# Patient Record
Sex: Female | Born: 1940
Health system: Southern US, Community
[De-identification: ages and names within clinical notes are randomized; demographics above are authoritative.]

## PROBLEM LIST (undated history)

## (undated) DIAGNOSIS — R112 Nausea with vomiting, unspecified: Secondary | ICD-10-CM

## (undated) DIAGNOSIS — C50919 Malignant neoplasm of unspecified site of unspecified female breast: Secondary | ICD-10-CM

## (undated) DIAGNOSIS — Z780 Asymptomatic menopausal state: Secondary | ICD-10-CM

## (undated) DIAGNOSIS — M8580 Other specified disorders of bone density and structure, unspecified site: Secondary | ICD-10-CM

## (undated) DIAGNOSIS — K219 Gastro-esophageal reflux disease without esophagitis: Secondary | ICD-10-CM

## (undated) DIAGNOSIS — I454 Nonspecific intraventricular block: Secondary | ICD-10-CM

## (undated) DIAGNOSIS — K649 Unspecified hemorrhoids: Secondary | ICD-10-CM

## (undated) DIAGNOSIS — Z803 Family history of malignant neoplasm of breast: Secondary | ICD-10-CM

## (undated) DIAGNOSIS — Z853 Personal history of malignant neoplasm of breast: Secondary | ICD-10-CM

## (undated) DIAGNOSIS — I8390 Asymptomatic varicose veins of unspecified lower extremity: Secondary | ICD-10-CM

## (undated) DIAGNOSIS — N3020 Other chronic cystitis without hematuria: Secondary | ICD-10-CM

## (undated) DIAGNOSIS — G8929 Other chronic pain: Secondary | ICD-10-CM

## (undated) DIAGNOSIS — I2510 Atherosclerotic heart disease of native coronary artery without angina pectoris: Secondary | ICD-10-CM

## (undated) DIAGNOSIS — B54 Unspecified malaria: Secondary | ICD-10-CM

## (undated) DIAGNOSIS — Z9889 Other specified postprocedural states: Secondary | ICD-10-CM

## (undated) DIAGNOSIS — Z8619 Personal history of other infectious and parasitic diseases: Secondary | ICD-10-CM

## (undated) DIAGNOSIS — N390 Urinary tract infection, site not specified: Secondary | ICD-10-CM

## (undated) DIAGNOSIS — B159 Hepatitis A without hepatic coma: Secondary | ICD-10-CM

## (undated) DIAGNOSIS — C801 Malignant (primary) neoplasm, unspecified: Secondary | ICD-10-CM

## (undated) DIAGNOSIS — M810 Age-related osteoporosis without current pathological fracture: Secondary | ICD-10-CM

## (undated) DIAGNOSIS — R351 Nocturia: Secondary | ICD-10-CM

## (undated) DIAGNOSIS — M1990 Unspecified osteoarthritis, unspecified site: Secondary | ICD-10-CM

## (undated) DIAGNOSIS — Z8489 Family history of other specified conditions: Secondary | ICD-10-CM

## (undated) DIAGNOSIS — M51369 Other intervertebral disc degeneration, lumbar region without mention of lumbar back pain or lower extremity pain: Secondary | ICD-10-CM

## (undated) DIAGNOSIS — M719 Bursopathy, unspecified: Secondary | ICD-10-CM

## (undated) DIAGNOSIS — M5136 Other intervertebral disc degeneration, lumbar region: Secondary | ICD-10-CM

## (undated) HISTORY — DX: Hepatitis a without hepatic coma: B15.9

## (undated) HISTORY — DX: Other specified disorders of bone density and structure, unspecified site: M85.80

## (undated) HISTORY — DX: Family history of malignant neoplasm of breast: Z80.3

## (undated) HISTORY — DX: Malignant neoplasm of unspecified site of unspecified female breast: C50.919

## (undated) HISTORY — DX: Personal history of malignant neoplasm of breast: Z85.3

## (undated) HISTORY — PX: BARIATRIC SURGERY: SHX1103

## (undated) HISTORY — DX: Unspecified malaria: B54

## (undated) HISTORY — DX: Malignant (primary) neoplasm, unspecified: C80.1

## (undated) HISTORY — DX: Nonspecific intraventricular block: I45.4

## (undated) HISTORY — PX: SKIN CANCER DESTRUCTION: SHX778

## (undated) HISTORY — DX: Unspecified osteoarthritis, unspecified site: M19.90

---

## 1974-04-08 DIAGNOSIS — B159 Hepatitis A without hepatic coma: Secondary | ICD-10-CM

## 1974-04-08 HISTORY — DX: Hepatitis a without hepatic coma: B15.9

## 2006-08-07 HISTORY — PX: VAGINAL HYSTERECTOMY: SUR661

## 2009-01-19 ENCOUNTER — Ambulatory Visit (HOSPITAL_COMMUNITY): Admission: RE | Admit: 2009-01-19 | Discharge: 2009-01-19 | Payer: Self-pay | Admitting: Obstetrics and Gynecology

## 2009-01-24 ENCOUNTER — Encounter: Admission: RE | Admit: 2009-01-24 | Discharge: 2009-01-24 | Payer: Self-pay | Admitting: Obstetrics and Gynecology

## 2009-07-25 ENCOUNTER — Encounter: Admission: RE | Admit: 2009-07-25 | Discharge: 2009-07-25 | Payer: Self-pay | Admitting: Obstetrics and Gynecology

## 2009-11-20 ENCOUNTER — Encounter: Admission: RE | Admit: 2009-11-20 | Discharge: 2009-11-20 | Payer: Self-pay | Admitting: Cardiology

## 2009-11-20 ENCOUNTER — Ambulatory Visit: Payer: Self-pay | Admitting: Cardiology

## 2009-11-21 ENCOUNTER — Telehealth (INDEPENDENT_AMBULATORY_CARE_PROVIDER_SITE_OTHER): Payer: Self-pay | Admitting: *Deleted

## 2009-11-22 ENCOUNTER — Encounter: Payer: Self-pay | Admitting: Cardiovascular Disease

## 2009-11-22 ENCOUNTER — Ambulatory Visit: Payer: Self-pay

## 2009-11-22 ENCOUNTER — Encounter (HOSPITAL_COMMUNITY): Admission: RE | Admit: 2009-11-22 | Discharge: 2009-12-28 | Payer: Self-pay | Admitting: Cardiology

## 2009-11-22 ENCOUNTER — Ambulatory Visit: Payer: Self-pay | Admitting: Cardiovascular Disease

## 2010-01-24 ENCOUNTER — Encounter: Admission: RE | Admit: 2010-01-24 | Discharge: 2010-01-24 | Payer: Self-pay | Admitting: Obstetrics and Gynecology

## 2010-02-02 ENCOUNTER — Encounter: Admission: RE | Admit: 2010-02-02 | Discharge: 2010-02-02 | Payer: Self-pay | Admitting: Obstetrics and Gynecology

## 2010-02-05 ENCOUNTER — Encounter: Admission: RE | Admit: 2010-02-05 | Discharge: 2010-02-05 | Payer: Self-pay | Admitting: Obstetrics and Gynecology

## 2010-02-06 HISTORY — PX: BREAST LUMPECTOMY: SHX2

## 2010-02-08 ENCOUNTER — Encounter: Admission: RE | Admit: 2010-02-08 | Discharge: 2010-02-08 | Payer: Self-pay | Admitting: Family Medicine

## 2010-03-07 ENCOUNTER — Encounter: Admission: RE | Admit: 2010-03-07 | Discharge: 2010-03-07 | Payer: Self-pay | Admitting: General Surgery

## 2010-03-07 ENCOUNTER — Ambulatory Visit (HOSPITAL_BASED_OUTPATIENT_CLINIC_OR_DEPARTMENT_OTHER): Admission: RE | Admit: 2010-03-07 | Discharge: 2010-03-07 | Payer: Self-pay | Admitting: General Surgery

## 2010-03-12 ENCOUNTER — Ambulatory Visit: Payer: Self-pay | Admitting: Oncology

## 2010-03-28 ENCOUNTER — Ambulatory Visit
Admission: RE | Admit: 2010-03-28 | Discharge: 2010-05-08 | Payer: Self-pay | Source: Home / Self Care | Attending: Radiation Oncology | Admitting: Radiation Oncology

## 2010-05-08 NOTE — Assessment & Plan Note (Signed)
Summary: Cardiology Nuclear Testing  Nuclear Med Background Indications for Stress Test: Evaluation for Ischemia, Abnormal EKG  Indications Comments: RBBB   History Comments: NO DOCUMENTED CAD  Symptoms: Chest Pain, Chest Tightness, Chest Tightness with Exertion, Dizziness, DOE  Symptoms Comments: Last episode of UE:AVWU a.m.   Nuclear Pre-Procedure Cardiac Risk Factors: Lipids, RBBB Caffeine/Decaff Intake: None NPO After: 7:30 AM Lungs: Clear IV 0.9% NS with Angio Cath: 22g     IV Site: Rt Hand IV Started by: Bonnita Levan RN Chest Size (in) 34     Cup Size B     Height (in): 63 Weight (lb): 156 BMI: 27.73  Nuclear Med Study 1 or 2 day study:  1 day     Stress Test Type:  Stress Reading MD:  Charlton Haws, MD     Referring MD:  Cassell Clement, MD Resting Radionuclide:  Technetium 59m Tetrofosmin     Resting Radionuclide Dose:  11.0 mCi  Stress Radionuclide:  Technetium 45m Tetrofosmin     Stress Radionuclide Dose:  33.0 mCi   Stress Protocol Exercise Time (min):  7:30 min     Max HR:  146 bpm     Predicted Max HR:  151 bpm  Max Systolic BP: 177 mm Hg     Percent Max HR:  96.69 %     METS: 8.5 Rate Pressure Product:  98119    Stress Test Technologist:  Rea [employer] CMA-N     Nuclear Technologist:  Domenic Polite CNMT  Rest Procedure  Myocardial perfusion imaging was performed at rest 45 minutes following the intravenous administration of Myoview Technetium 48m Tetrofosmin.  Stress Procedure  The patient exercised for 7:30.  The patient stopped due to fatigue and denied any chest pain.  There were no significant ST-T wave changes, only occasional PAC's.  Myoview was injected at peak exercise and myocardial perfusion imaging was performed after a brief delay.  QPS Raw Data Images:  Normal; no motion artifact; normal heart/lung ratio. Stress Images:  NI: Uniform and normal uptake of tracer in all myocardial segments. Rest Images:  Normal homogeneous uptake in all areas  of the myocardium. Subtraction (SDS):  Normal Transient Ischemic Dilatation:  1.06  (Normal <1.22)  Lung/Heart Ratio:  .31  (Normal <0.45)  Quantitative Gated Spect Images QGS EDV:  72 ml QGS ESV:  21 ml QGS EF:  70 % QGS cine images:  normal  Findings Normal nuclear study      Overall Impression  Exercise Capacity: Fair exercise capacity. BP Response: Normal blood pressure response. Clinical Symptoms: No chest pain ECG Impression: No significant ST segment change suggestive of ischemia. Overall Impression: Normal stress nuclear study.

## 2010-05-08 NOTE — Progress Notes (Signed)
Summary: Nuclear pre procedure  Phone Note Outgoing Call Call back at [facility] Phone [phone]   Call placed by: Rea College, CMA,  November 21, 2009 4:32 PM Call placed to: Patient Summary of Call: Reviewed information on Myoview Information Sheet (see scanned document for further details).  Spoke with patient.      Nuclear Med Background Indications for Stress Test: Evaluation for Ischemia, Abnormal EKG     Symptoms: Chest Pain, Chest Tightness, Chest Tightness with Exertion, Dizziness    Nuclear Pre-Procedure Cardiac Risk Factors: Lipids, RBBB

## 2010-05-09 ENCOUNTER — Ambulatory Visit: Payer: MEDICARE | Attending: Radiation Oncology | Admitting: Radiation Oncology

## 2010-05-09 DIAGNOSIS — C50419 Malignant neoplasm of upper-outer quadrant of unspecified female breast: Secondary | ICD-10-CM | POA: Insufficient documentation

## 2010-05-09 DIAGNOSIS — Z510 Encounter for antineoplastic radiation therapy: Secondary | ICD-10-CM | POA: Insufficient documentation

## 2010-05-09 DIAGNOSIS — K3189 Other diseases of stomach and duodenum: Secondary | ICD-10-CM | POA: Insufficient documentation

## 2010-05-09 DIAGNOSIS — R1013 Epigastric pain: Secondary | ICD-10-CM | POA: Insufficient documentation

## 2010-05-31 ENCOUNTER — Encounter (HOSPITAL_BASED_OUTPATIENT_CLINIC_OR_DEPARTMENT_OTHER): Payer: MEDICARE | Admitting: Oncology

## 2010-05-31 ENCOUNTER — Other Ambulatory Visit: Payer: Self-pay | Admitting: Oncology

## 2010-05-31 DIAGNOSIS — C50419 Malignant neoplasm of upper-outer quadrant of unspecified female breast: Secondary | ICD-10-CM

## 2010-05-31 LAB — CBC WITH DIFFERENTIAL/PLATELET
BASO%: 0.4 % (ref 0.0–2.0)
Basophils Absolute: 0 10*3/uL (ref 0.0–0.1)
EOS%: 1.5 % (ref 0.0–7.0)
HCT: 36.7 % (ref 34.8–46.6)
HGB: 12.5 g/dL (ref 11.6–15.9)
LYMPH%: 15.1 % (ref 14.0–49.7)
MCH: 29.3 pg (ref 25.1–34.0)
MCHC: 34 g/dL (ref 31.5–36.0)
MCV: 86.2 fL (ref 79.5–101.0)
MONO#: 0.4 10*3/uL (ref 0.1–0.9)
MONO%: 7.2 % (ref 0.0–14.0)
NEUT#: 4.5 10*3/uL (ref 1.5–6.5)
NEUT%: 75.8 % (ref 38.4–76.8)
Platelets: 181 10*3/uL (ref 145–400)
RBC: 4.25 10*6/uL (ref 3.70–5.45)
RDW: 13.9 % (ref 11.2–14.5)
lymph#: 0.9 10*3/uL (ref 0.9–3.3)

## 2010-05-31 LAB — COMPREHENSIVE METABOLIC PANEL
ALT: 10 U/L (ref 0–35)
AST: 20 U/L (ref 0–37)
Albumin: 3.7 g/dL (ref 3.5–5.2)
Alkaline Phosphatase: 64 U/L (ref 39–117)
BUN: 14 mg/dL (ref 6–23)
CO2: 27 mEq/L (ref 19–32)
Calcium: 9 mg/dL (ref 8.4–10.5)
Chloride: 103 mEq/L (ref 96–112)
Creatinine, Ser: 0.59 mg/dL (ref 0.40–1.20)
Sodium: 137 mEq/L (ref 135–145)
Total Bilirubin: 0.4 mg/dL (ref 0.3–1.2)

## 2010-06-01 LAB — VITAMIN D 25 HYDROXY (VIT D DEFICIENCY, FRACTURES): Vit D, 25-Hydroxy: 37 ng/mL (ref 30–89)

## 2010-06-06 ENCOUNTER — Encounter (HOSPITAL_BASED_OUTPATIENT_CLINIC_OR_DEPARTMENT_OTHER): Payer: MEDICARE | Admitting: Oncology

## 2010-06-06 DIAGNOSIS — M810 Age-related osteoporosis without current pathological fracture: Secondary | ICD-10-CM

## 2010-06-06 DIAGNOSIS — C50419 Malignant neoplasm of upper-outer quadrant of unspecified female breast: Secondary | ICD-10-CM

## 2010-06-06 DIAGNOSIS — Z803 Family history of malignant neoplasm of breast: Secondary | ICD-10-CM

## 2010-06-08 ENCOUNTER — Ambulatory Visit: Payer: MEDICARE | Attending: Radiation Oncology | Admitting: Radiation Oncology

## 2010-06-19 LAB — COMPREHENSIVE METABOLIC PANEL
ALT: 10 U/L (ref 0–35)
AST: 16 U/L (ref 0–37)
Albumin: 3.8 g/dL (ref 3.5–5.2)
Alkaline Phosphatase: 69 U/L (ref 39–117)
BUN: 12 mg/dL (ref 6–23)
CO2: 29 mEq/L (ref 19–32)
Calcium: 9.6 mg/dL (ref 8.4–10.5)
Chloride: 105 mEq/L (ref 96–112)
Creatinine, Ser: 0.73 mg/dL (ref 0.4–1.2)
GFR calc Af Amer: >60 mL/min (ref >60)
GFR calc non Af Amer: >60 mL/min (ref >60)
Glucose, Bld: 111 mg/dL — ABNORMAL HIGH (ref 70–99)
Potassium: 4.4 mEq/L (ref 3.5–5.1)
Sodium: 140 mEq/L (ref 135–145)
Total Bilirubin: 0.5 mg/dL (ref 0.3–1.2)
Total Protein: 6.9 g/dL (ref 6.0–8.3)

## 2010-06-19 LAB — CBC
HCT: 37.9 % (ref 36.0–46.0)
Hemoglobin: 12.4 g/dL (ref 12.0–15.0)
MCH: 29.3 pg (ref 26.0–34.0)
MCHC: 32.7 g/dL (ref 30.0–36.0)
MCV: 89.6 fL (ref 78.0–100.0)
Platelets: 208 10*3/uL (ref 150–400)
RBC: 4.23 MIL/uL (ref 3.87–5.11)
RDW: 13 % (ref 11.5–15.5)
WBC: 8 10*3/uL (ref 4.0–10.5)

## 2010-06-19 LAB — DIFFERENTIAL
Basophils Absolute: 0.1 10*3/uL (ref 0.0–0.1)
Basophils Relative: 1 % (ref 0–1)
Eosinophils Absolute: 0.1 10*3/uL (ref 0.0–0.7)
Eosinophils Relative: 1 % (ref 0–5)
Lymphocytes Relative: 17 % (ref 12–46)
Lymphs Abs: 1.4 10*3/uL (ref 0.7–4.0)
Monocytes Absolute: 0.5 10*3/uL (ref 0.1–1.0)
Monocytes Relative: 7 % (ref 3–12)
Neutro Abs: 6 10*3/uL (ref 1.7–7.7)
Neutrophils Relative %: 75 % (ref 43–77)

## 2010-06-19 LAB — CANCER ANTIGEN 27.29: CA 27.29: 17 U/mL (ref 0–39)

## 2010-06-19 LAB — POCT HEMOGLOBIN-HEMACUE: Hemoglobin: 13.7 g/dL (ref 12.0–15.0)

## 2010-08-01 ENCOUNTER — Encounter (INDEPENDENT_AMBULATORY_CARE_PROVIDER_SITE_OTHER): Payer: Self-pay | Admitting: General Surgery

## 2010-08-03 ENCOUNTER — Encounter (INDEPENDENT_AMBULATORY_CARE_PROVIDER_SITE_OTHER): Payer: Self-pay | Admitting: General Surgery

## 2010-08-28 ENCOUNTER — Encounter: Payer: Medicare Other | Admitting: Oncology

## 2010-08-28 ENCOUNTER — Other Ambulatory Visit: Payer: Self-pay | Admitting: Oncology

## 2010-08-28 LAB — CBC WITH DIFFERENTIAL/PLATELET
BASO%: 0.6 % (ref 0.0–2.0)
LYMPH%: 17.8 % (ref 14.0–49.7)
MCHC: 33.2 g/dL (ref 31.5–36.0)
MONO#: 0.4 10*3/uL (ref 0.1–0.9)
NEUT#: 4.6 10*3/uL (ref 1.5–6.5)
Platelets: 164 10*3/uL (ref 145–400)
RBC: 4.14 10*6/uL (ref 3.70–5.45)
RDW: 13.1 % (ref 11.2–14.5)
WBC: 6.3 10*3/uL (ref 3.9–10.3)

## 2010-08-28 LAB — COMPREHENSIVE METABOLIC PANEL
ALT: 7 U/L (ref 0–35)
Albumin: 3.5 g/dL (ref 3.5–5.2)
Alkaline Phosphatase: 58 U/L (ref 39–117)
CO2: 29 mEq/L (ref 19–32)
Potassium: 4 mEq/L (ref 3.5–5.3)
Sodium: 137 mEq/L (ref 135–145)
Total Bilirubin: 0.2 mg/dL — ABNORMAL LOW (ref 0.3–1.2)
Total Protein: 6.3 g/dL (ref 6.0–8.3)

## 2010-09-04 ENCOUNTER — Encounter (HOSPITAL_BASED_OUTPATIENT_CLINIC_OR_DEPARTMENT_OTHER): Payer: Medicare Other | Admitting: Oncology

## 2010-09-04 ENCOUNTER — Other Ambulatory Visit: Payer: Self-pay | Admitting: Oncology

## 2010-09-04 DIAGNOSIS — Z853 Personal history of malignant neoplasm of breast: Secondary | ICD-10-CM

## 2010-09-04 DIAGNOSIS — Z170 Estrogen receptor positive status [ER+]: Secondary | ICD-10-CM

## 2010-09-04 DIAGNOSIS — C50419 Malignant neoplasm of upper-outer quadrant of unspecified female breast: Secondary | ICD-10-CM

## 2010-10-24 ENCOUNTER — Ambulatory Visit (INDEPENDENT_AMBULATORY_CARE_PROVIDER_SITE_OTHER): Payer: Self-pay | Admitting: General Surgery

## 2010-11-27 ENCOUNTER — Ambulatory Visit (INDEPENDENT_AMBULATORY_CARE_PROVIDER_SITE_OTHER): Payer: Self-pay | Admitting: General Surgery

## 2010-12-03 ENCOUNTER — Ambulatory Visit (INDEPENDENT_AMBULATORY_CARE_PROVIDER_SITE_OTHER): Payer: Self-pay | Admitting: General Surgery

## 2011-01-14 ENCOUNTER — Ambulatory Visit (INDEPENDENT_AMBULATORY_CARE_PROVIDER_SITE_OTHER): Payer: Medicare Other | Admitting: General Surgery

## 2011-01-14 ENCOUNTER — Encounter (INDEPENDENT_AMBULATORY_CARE_PROVIDER_SITE_OTHER): Payer: Self-pay | Admitting: General Surgery

## 2011-01-14 VITALS — BP 120/80 | HR 80 | Temp 96.7°F | Ht 63.0 in | Wt 164.0 lb

## 2011-01-14 DIAGNOSIS — C50919 Malignant neoplasm of unspecified site of unspecified female breast: Secondary | ICD-10-CM

## 2011-01-14 DIAGNOSIS — C50912 Malignant neoplasm of unspecified site of left female breast: Secondary | ICD-10-CM | POA: Insufficient documentation

## 2011-01-14 DIAGNOSIS — Z853 Personal history of malignant neoplasm of breast: Secondary | ICD-10-CM

## 2011-01-14 NOTE — Patient Instructions (Signed)
Breast Problems and Self Exam Completing monthly breast exams may pick up problems early and save lives. There can be numerous causes of swelling, tenderness or lumps in the breasts. Some of these causes are:   Fibrocystic breast syndrome (noncancerous lumps). This is the most common cause of lumps in the breast.   Fibroadenoma breast tumors of unknown cause. These are noncancerous (benign) lumps.   Benign fatty tumors (lipomas).   Cancer of the breast.  By doing monthly breast exams, you get to know how your breasts feel and how they can change from month to month. This allows you to notice changes early. It can also offer you some reassurance that your breast health is good.  BREAST SELF EXAM There are a few points to follow when doing a thorough breast exam. The best time to examine your breasts is 5 to 7 days after your menstrual period is over. During menstruation, the breasts are lumpier, and it may be more difficult to pick up changes. If you do not menstruate, have reached menopause, or had a hysterectomy (uterus removal), examine your breasts the first day of every month. After 3 to 4 months, you will become more familiar with the variations of your breasts and more comfortable with the exam.  Perform your breast exam monthly. Keep a written record with breast changes or normal findings for each breast. This makes it easier to be sure of changes, so you do not need to depend only on memory for size, tenderness, or location. Try to do the exam at the same time each month, and write down where you are in your menstrual cycle, if you are still menstruating.   Look at your breasts. Stand in front of a mirror with your hands clasped behind your head. Tighten your chest muscles and look for asymmetry. This means a difference in shape or contour from one breast to the other, such as puckers, dips or bumps. Also, look for skin changes.    Lean forward with your hands on your hips. Again, look for  symmetry and skin changes.   While showering, soap the breasts. Then, carefully feel the breasts with your fingertips, while holding the other arm (on the side of the breast you are examining) over your head. Do this with each breast, carefully feeling for lumps or changes. Typically, a circular motion with moderate fingertip pressure should be used.    Repeat this exam while lying on your back. Put your arm behind your head and a pillow under your shoulders. Again, use your fingertips to examine both breasts, feeling for lumps and thickening. Begin at the top of your breast, and go clockwise around the whole breast.   At the end of your exam, gently squeeze each nipple to see if there is any drainage of fluids. Look for nipple changes, dimpling, or redness.   Lastly, examine the upper chest and collarbone (clavicle) areas, and in your armpits.  It is not necessary to be alarmed if you find a breast lump. Most of them are not cancerous. However, it is necessary to see your caregiver if a lump is found, in order to have it looked at. Document Released: 03/25/2005 Document Re-Released: 04/16/2009 [employer] Patient Information 2011 [employer]. 

## 2011-01-14 NOTE — Progress Notes (Signed)
Subjective:     Patient ID: Anne Obrien, female   DOB: [birth year], 70 y.o.   MRN: [id number]  HPI This is a 70 year old female who had a stage I left breast cancer treated with lumpectomy and sentinel node biopsy in November of 2011. Subsequently she went on to undergo radiation followed by being placed on tamoxifen. She is now on tamoxifen and has some occasional leg cramps as well as some fatigue. There are a couple of times per week where she does not take tamoxifen due to some of the side effects. She is due to see medical oncology in December.  She reports some occasional soreness in the region of her incision of her left breast but is otherwise doing very well without any complaints. She is due to get a mammogram in November.  Review of Systems     Objective:   Physical Exam  Constitutional: She appears well-developed and well-nourished.  Pulmonary/Chest: Right breast exhibits no inverted nipple, no mass, no nipple discharge, no skin change and no tenderness. Left breast exhibits no inverted nipple, no mass, no nipple discharge, no skin change and no tenderness. Breasts are symmetrical.    Lymphadenopathy:    She has no cervical adenopathy.    She has no axillary adenopathy.       Right: No supraclavicular adenopathy present.       Left: No supraclavicular adenopathy present.       Assessment:     Stage I left breast cancer treated with lumpectomy/sentinel node biopsy, s/p xrt, on tamoxifen    Plan:     She has no evidence of any recurrence on her exam today at all. She is taking her tamoxifen but is having some trouble with that and is due to see Dr. Donnie Coffin again in December.  She is due to get her mammogram in November. I encourage her to continue her own self exams as well. She would like to followup with both Dr. Zachery Dauer and Dr. Donnie Coffin over the long term. I think that will be fine and I asked her to come back and see me as needed or she can get referred back if there is  anything that I would need to see.

## 2011-02-08 ENCOUNTER — Ambulatory Visit
Admission: RE | Admit: 2011-02-08 | Discharge: 2011-02-08 | Disposition: A | Discharge status: Home / Self Care | Payer: Medicare Other | Source: Ambulatory Visit | Attending: Oncology | Admitting: Oncology

## 2011-02-08 DIAGNOSIS — Z853 Personal history of malignant neoplasm of breast: Secondary | ICD-10-CM

## 2011-03-04 ENCOUNTER — Telehealth: Payer: Self-pay | Admitting: *Deleted

## 2011-03-04 NOTE — Telephone Encounter (Signed)
informed patient of the new date and time on 04-01-2011 at 11:30am

## 2011-03-29 ENCOUNTER — Other Ambulatory Visit: Payer: Self-pay | Admitting: Oncology

## 2011-03-29 ENCOUNTER — Other Ambulatory Visit (HOSPITAL_BASED_OUTPATIENT_CLINIC_OR_DEPARTMENT_OTHER): Payer: Medicare Other | Admitting: Lab

## 2011-03-29 DIAGNOSIS — Z803 Family history of malignant neoplasm of breast: Secondary | ICD-10-CM

## 2011-03-29 DIAGNOSIS — C50419 Malignant neoplasm of upper-outer quadrant of unspecified female breast: Secondary | ICD-10-CM

## 2011-03-29 DIAGNOSIS — M810 Age-related osteoporosis without current pathological fracture: Secondary | ICD-10-CM

## 2011-03-29 LAB — COMPREHENSIVE METABOLIC PANEL
AST: 17 U/L (ref 0–37)
Albumin: 3.5 g/dL (ref 3.5–5.2)
Alkaline Phosphatase: 62 U/L (ref 39–117)
BUN: 14 mg/dL (ref 6–23)
Glucose, Bld: 131 mg/dL — ABNORMAL HIGH (ref 70–99)
Potassium: 3.4 mEq/L — ABNORMAL LOW (ref 3.5–5.3)
Sodium: 137 mEq/L (ref 135–145)
Total Bilirubin: 0.2 mg/dL — ABNORMAL LOW (ref 0.3–1.2)
Total Protein: 6.9 g/dL (ref 6.0–8.3)

## 2011-03-29 LAB — CBC WITH DIFFERENTIAL/PLATELET
EOS%: 1.2 % (ref 0.0–7.0)
LYMPH%: 16.8 % (ref 14.0–49.7)
MCH: 30.6 pg (ref 25.1–34.0)
MCV: 89.1 fL (ref 79.5–101.0)
MONO%: 6.5 % (ref 0.0–14.0)
Platelets: 175 10*3/uL (ref 145–400)
RBC: 4.09 10*6/uL (ref 3.70–5.45)
RDW: 12.8 % (ref 11.2–14.5)

## 2011-04-01 ENCOUNTER — Ambulatory Visit (HOSPITAL_BASED_OUTPATIENT_CLINIC_OR_DEPARTMENT_OTHER): Payer: Medicare Other | Admitting: Oncology

## 2011-04-01 ENCOUNTER — Telehealth: Payer: Self-pay | Admitting: *Deleted

## 2011-04-01 VITALS — BP 123/77 | HR 71 | Temp 98.2°F | Ht 63.0 in | Wt 164.8 lb

## 2011-04-01 DIAGNOSIS — Z79810 Long term (current) use of selective estrogen receptor modulators (SERMs): Secondary | ICD-10-CM

## 2011-04-01 DIAGNOSIS — Z170 Estrogen receptor positive status [ER+]: Secondary | ICD-10-CM

## 2011-04-01 DIAGNOSIS — C50919 Malignant neoplasm of unspecified site of unspecified female breast: Secondary | ICD-10-CM

## 2011-04-01 DIAGNOSIS — R5381 Other malaise: Secondary | ICD-10-CM

## 2011-04-01 NOTE — Patient Instructions (Signed)
xxxxxx

## 2011-04-01 NOTE — Telephone Encounter (Signed)
gave patient appointment for six months with the labs one week before the doctor's visit

## 2011-04-01 NOTE — Progress Notes (Signed)
Hematology and Oncology Follow Up Visit  Anne Obrien [id number] 06-21-1940 70 y.o. 04/01/2011 11:52 AM PCP Dr Juluis Rainier Principle Diagnosis: :  Invasive ductal cancer as well as DCIS T1b N0 ER/PR positive, status post lumpectomy 03/07/2010 status post radiation therapy completed 05/09/2010, on tamoxifen.    Interim History:  There have been no intercurrent illness, hospitalizations or medication changes.  Medications: I have reviewed the patient's current medications.  Allergies: No Known Allergies  Past Medical History, Surgical history, Social history, and Family History were reviewed and updated.  Review of Systems: Constitutional:  Negative for fever, chills, night sweats, anorexia, weight loss, pain. Cardiovascular: negative Respiratory: negative Neurological: negative Dermatological: negative ENT: negative Skin Gastrointestinal: negative Genito-Urinary: negative Hematological and Lymphatic: negative Breast: negative Musculoskeletal: negative Remaining ROS negative.  Physical Exam: Blood pressure 123/77, pulse 71, temperature 98.2 F (36.8 C), temperature source Oral, height 5\' 3"  (1.6 m), weight 164 lb 12.8 oz (74.753 kg). ECOG: 0 General appearance: alert, cooperative and appears stated age Head: Normocephalic, without obvious abnormality, atraumatic Neck: no adenopathy, no carotid bruit, no JVD, supple, symmetrical, trachea midline and thyroid not enlarged, symmetric, no tenderness/mass/nodules Lymph nodes: Cervical, supraclavicular, and axillary nodes normal. Cardiac : regular rate and rhythm, no murmurs or gallops Pulmonary:clear to auscultation bilaterally and normal percussion bilaterally Breasts: inspection negative, no nipple discharge or bleeding, no masses or nodularity palpable Abdomen:soft, non-tender; bowel sounds normal; no masses,  no organomegaly Extremities negative Neuro: alert, oriented, normal speech, no focal findings or movement  disorder noted  Lab Results: Lab Results  Component Value Date   WBC 6.1 03/29/2011   HGB 12.5 03/29/2011   HCT 36.4 03/29/2011   MCV 89.1 03/29/2011   PLT 175 03/29/2011     Chemistry      Component Value Date/Time   NA 137 03/29/2011 1508   K 3.4* 03/29/2011 1508   CL 102 03/29/2011 1508   CO2 30 03/29/2011 1508   BUN 14 03/29/2011 1508   CREATININE 0.75 03/29/2011 1508      Component Value Date/Time   CALCIUM 9.3 03/29/2011 1508   ALKPHOS 62 03/29/2011 1508   AST 17 03/29/2011 1508   ALT 7 03/29/2011 1508   BILITOT 0.2* 03/29/2011 1508      .pathology. Radiological Studies: chest X-ray  n/a Mammogram Due 11/13 Bone density n/a  Impression and Plan: Anne Obrien is doing well, apart from some fatigue on tamoxifen. I will see her in 6 months..  More than 50% of the visit was spent in patient-related counselling   Pierce Crane, MD 12/24/201211:52 AM

## 2011-04-05 ENCOUNTER — Ambulatory Visit: Payer: Medicare Other | Admitting: Oncology

## 2011-05-14 NOTE — Progress Notes (Signed)
Pt's labs from 12/21 routed to pt's PCP Dr Juluis Rainier per pt request. dph

## 2011-05-22 ENCOUNTER — Ambulatory Visit: Payer: Medicare Other | Attending: Family Medicine | Admitting: Physical Therapy

## 2011-05-22 DIAGNOSIS — M545 Low back pain, unspecified: Secondary | ICD-10-CM | POA: Insufficient documentation

## 2011-05-22 DIAGNOSIS — M25569 Pain in unspecified knee: Secondary | ICD-10-CM | POA: Insufficient documentation

## 2011-05-22 DIAGNOSIS — IMO0001 Reserved for inherently not codable concepts without codable children: Secondary | ICD-10-CM | POA: Insufficient documentation

## 2011-05-24 ENCOUNTER — Ambulatory Visit: Payer: Medicare Other | Admitting: Physical Therapy

## 2011-05-28 ENCOUNTER — Ambulatory Visit: Payer: Medicare Other | Admitting: Physical Therapy

## 2011-05-30 ENCOUNTER — Encounter: Payer: Medicare Other | Admitting: Physical Therapy

## 2011-06-03 ENCOUNTER — Ambulatory Visit: Payer: Medicare Other | Admitting: Physical Therapy

## 2011-06-06 ENCOUNTER — Encounter: Payer: Medicare Other | Admitting: Physical Therapy

## 2011-06-11 ENCOUNTER — Encounter: Payer: Medicare Other | Admitting: Physical Therapy

## 2011-06-13 ENCOUNTER — Encounter: Payer: Medicare Other | Admitting: Physical Therapy

## 2011-06-18 ENCOUNTER — Encounter: Payer: Medicare Other | Admitting: Physical Therapy

## 2011-06-20 ENCOUNTER — Encounter: Payer: Medicare Other | Admitting: Physical Therapy

## 2011-07-10 ENCOUNTER — Other Ambulatory Visit: Payer: Self-pay | Admitting: Oncology

## 2011-07-10 DIAGNOSIS — C50919 Malignant neoplasm of unspecified site of unspecified female breast: Secondary | ICD-10-CM

## 2011-09-05 ENCOUNTER — Other Ambulatory Visit (HOSPITAL_BASED_OUTPATIENT_CLINIC_OR_DEPARTMENT_OTHER): Payer: Medicare Other | Admitting: Lab

## 2011-09-05 ENCOUNTER — Other Ambulatory Visit: Payer: Self-pay | Admitting: *Deleted

## 2011-09-05 DIAGNOSIS — C50919 Malignant neoplasm of unspecified site of unspecified female breast: Secondary | ICD-10-CM

## 2011-09-05 LAB — CBC WITH DIFFERENTIAL/PLATELET
Eosinophils Absolute: 0.1 10*3/uL (ref 0.0–0.5)
LYMPH%: 19.2 % (ref 14.0–49.7)
MONO#: 0.4 10*3/uL (ref 0.1–0.9)
NEUT#: 4.1 10*3/uL (ref 1.5–6.5)
Platelets: 166 10*3/uL (ref 145–400)
RBC: 4.2 10*6/uL (ref 3.70–5.45)
WBC: 5.7 10*3/uL (ref 3.9–10.3)
lymph#: 1.1 10*3/uL (ref 0.9–3.3)
nRBC: 0 % (ref 0–0)

## 2011-09-06 LAB — COMPREHENSIVE METABOLIC PANEL
ALT: <8 U/L (ref 0–35)
Albumin: 3.9 g/dL (ref 3.5–5.2)
CO2: 25 mEq/L (ref 19–32)
Calcium: 9.4 mg/dL (ref 8.4–10.5)
Chloride: 106 mEq/L (ref 96–112)
Creatinine, Ser: 0.72 mg/dL (ref 0.50–1.10)
Potassium: 4.1 mEq/L (ref 3.5–5.3)
Sodium: 139 mEq/L (ref 135–145)
Total Protein: 6.2 g/dL (ref 6.0–8.3)

## 2011-09-06 LAB — VITAMIN D 25 HYDROXY (VIT D DEFICIENCY, FRACTURES): Vit D, 25-Hydroxy: 46 ng/mL (ref 30–89)

## 2011-09-12 ENCOUNTER — Telehealth: Payer: Self-pay | Admitting: *Deleted

## 2011-09-12 ENCOUNTER — Ambulatory Visit (HOSPITAL_BASED_OUTPATIENT_CLINIC_OR_DEPARTMENT_OTHER): Payer: Medicare Other | Admitting: Oncology

## 2011-09-12 VITALS — BP 120/76 | HR 80 | Temp 98.8°F | Ht 63.0 in | Wt 161.8 lb

## 2011-09-12 DIAGNOSIS — C50919 Malignant neoplasm of unspecified site of unspecified female breast: Secondary | ICD-10-CM

## 2011-09-12 DIAGNOSIS — C50419 Malignant neoplasm of upper-outer quadrant of unspecified female breast: Secondary | ICD-10-CM

## 2011-09-12 DIAGNOSIS — Z79899 Other long term (current) drug therapy: Secondary | ICD-10-CM

## 2011-09-12 DIAGNOSIS — Z170 Estrogen receptor positive status [ER+]: Secondary | ICD-10-CM

## 2011-09-12 NOTE — Progress Notes (Signed)
Hematology and Oncology Follow Up Visit  Anne Obrien [id number] 1940-09-26 71 y.o. 09/12/2011 4:39 PM   DIAGNOSIS: 71 year old with history of T1 B. N0 ER/PR positive breast cancer, status post lumpectomy 03/07/2010, status post radiation therapy completed 05/18/2010 currently on tamoxifen. Encounter Diagnosis  Name Primary?  . Breast cancer, stage 1 Yes     PAST THERAPY:    Interim History:  Patient is here for followup. She is feeling fairly well. She had recent lab work. She is due for a followup mammogram in November. She'll also be due for followup bone density test the summer. She is taking extra vitamin D. He is tolerating the tamoxifen well with minimal hot flashes no real problems with vaginal bleeding.  Medications: I have reviewed the patient's current medications.  Allergies: No Known Allergies  Past Medical History, Surgical history, Social history, and Family History were reviewed and updated.  Review of Systems: Constitutional:  Negative for fever, chills, night sweats, anorexia, weight loss, pain. Cardiovascular: no chest pain or dyspnea on exertion Respiratory: no cough, shortness of breath, or wheezing Neurological: no TIA or stroke symptoms Dermatological: negative ENT: negative Skin Gastrointestinal: negative Genito-Urinary: negative Hematological and Lymphatic: negative Breast: negative Musculoskeletal: negative Remaining ROS negative.  Physical Exam:  Blood pressure 120/76, pulse 80, temperature 98.8 F (37.1 C), temperature source Oral, height 5\' 3"  (1.6 m), weight 161 lb 12.8 oz (73.392 kg).  ECOG: 0  HEENT:  Sclerae anicteric, conjunctivae pink.  Oropharynx clear.  No mucositis or candidiasis.  Nodes:  No cervical, supraclavicular, or axillary lymphadenopathy palpated.  Breast Exam:  Right breast is benign.  No masses, discharge, skin change, or nipple inversion.  Left breast is status post lumpectomy and radiation. There is some fullness in  the central portion of the breast which I suspect is postoperative in nature..  No masses, discharge, skin change, or nipple inversion..  Lungs:  Clear to auscultation bilaterally.  No crackles, rhonchi, or wheezes.  Heart:  Regular rate and rhythm.  Abdomen:  Soft, nontender.  Positive bowel sounds.  No organomegaly or masses palpated.  Musculoskeletal:  No focal spinal tenderness to palpation.  Extremities:  Benign.  No peripheral edema or cyanosis.  Skin:  Benign.  Neuro:  Nonfocal.     Lab Results: Lab Results  Component Value Date   WBC 5.7 09/05/2011   HGB 12.5 09/05/2011   HCT 37.6 09/05/2011   MCV 89.6 09/05/2011   PLT 166 09/05/2011     Chemistry      Component Value Date/Time   NA 139 09/05/2011 1040   K 4.1 09/05/2011 1040   CL 106 09/05/2011 1040   CO2 25 09/05/2011 1040   BUN 12 09/05/2011 1040   CREATININE 0.72 09/05/2011 1040      Component Value Date/Time   CALCIUM 9.4 09/05/2011 1040   ALKPHOS 44 09/05/2011 1040   AST 15 09/05/2011 1040   ALT <8 09/05/2011 1040   BILITOT 0.3 09/05/2011 1040       Radiological Studies:  No results found.   IMPRESSIONS AND PLAN: A 71 y.o. female with   History of node-negative ER/PR positive breast cancer on tamoxifen. She seems to be tolerating this fairly well. She is occasional hot flashes. We will schedule her for followup mammogram and bone density test. I will see her in 6 months time. She recently had some injections to her back because of a "pinched nerve" of there seems to be some vague discomfort in her lower back. I  will plan to see her in followup in 6 months time. Spent more than half the time coordinating care.    Anne Obrien 6/6/20134:39 PM

## 2011-09-12 NOTE — Telephone Encounter (Signed)
gave patient appointemnt for 03-13-2012 starting at 11:00am printed out calendar and gave to the patient

## 2011-09-24 ENCOUNTER — Other Ambulatory Visit: Payer: Medicare Other | Admitting: Lab

## 2011-10-01 ENCOUNTER — Ambulatory Visit: Payer: Medicare Other | Admitting: Oncology

## 2012-02-14 ENCOUNTER — Ambulatory Visit
Admission: RE | Admit: 2012-02-14 | Discharge: 2012-02-14 | Disposition: A | Discharge status: Home / Self Care | Payer: Medicare Other | Source: Ambulatory Visit | Attending: Oncology | Admitting: Oncology

## 2012-02-14 DIAGNOSIS — C50919 Malignant neoplasm of unspecified site of unspecified female breast: Secondary | ICD-10-CM

## 2012-03-13 ENCOUNTER — Other Ambulatory Visit (HOSPITAL_BASED_OUTPATIENT_CLINIC_OR_DEPARTMENT_OTHER): Payer: Medicare Other | Admitting: Lab

## 2012-03-13 ENCOUNTER — Ambulatory Visit (HOSPITAL_BASED_OUTPATIENT_CLINIC_OR_DEPARTMENT_OTHER): Payer: Medicare Other | Admitting: Oncology

## 2012-03-13 ENCOUNTER — Telehealth: Payer: Self-pay | Admitting: Oncology

## 2012-03-13 VITALS — BP 134/77 | HR 66 | Temp 98.1°F | Resp 20 | Ht 63.0 in | Wt 161.3 lb

## 2012-03-13 DIAGNOSIS — C50419 Malignant neoplasm of upper-outer quadrant of unspecified female breast: Secondary | ICD-10-CM

## 2012-03-13 DIAGNOSIS — Z170 Estrogen receptor positive status [ER+]: Secondary | ICD-10-CM

## 2012-03-13 DIAGNOSIS — M545 Low back pain: Secondary | ICD-10-CM

## 2012-03-13 DIAGNOSIS — C50919 Malignant neoplasm of unspecified site of unspecified female breast: Secondary | ICD-10-CM

## 2012-03-13 LAB — COMPREHENSIVE METABOLIC PANEL (CC13)
Albumin: 3.5 g/dL (ref 3.5–5.0)
BUN: 18 mg/dL (ref 7.0–26.0)
CO2: 27 mEq/L (ref 22–29)
Glucose: 98 mg/dl (ref 70–99)
Sodium: 141 mEq/L (ref 136–145)
Total Bilirubin: 0.39 mg/dL (ref 0.20–1.20)
Total Protein: 6.3 g/dL — ABNORMAL LOW (ref 6.4–8.3)

## 2012-03-13 LAB — CBC WITH DIFFERENTIAL/PLATELET
Basophils Absolute: 0 10*3/uL (ref 0.0–0.1)
EOS%: 1.5 % (ref 0.0–7.0)
HCT: 36.7 % (ref 34.8–46.6)
HGB: 12.4 g/dL (ref 11.6–15.9)
LYMPH%: 17.6 % (ref 14.0–49.7)
MCH: 30.3 pg (ref 25.1–34.0)
MCHC: 33.7 g/dL (ref 31.5–36.0)
MONO#: 0.4 10*3/uL (ref 0.1–0.9)
NEUT%: 74 % (ref 38.4–76.8)
Platelets: 171 10*3/uL (ref 145–400)
lymph#: 1.3 10*3/uL (ref 0.9–3.3)

## 2012-03-13 NOTE — Telephone Encounter (Signed)
gve the pt her June 2014 appt calendar. Pt is aware that she will be called with the bone scan appt.

## 2012-03-13 NOTE — Progress Notes (Signed)
Hematology and Oncology Follow Up Visit  Anne Obrien [id number] July 06, 1940 71 y.o. 03/13/2012 1:28 PM   DIAGNOSIS: 71 year old with history of T1 B. N0 ER/PR positive breast cancer, status post lumpectomy 03/07/2010, status post radiation therapy completed 05/18/2010 currently on tamoxifen. No diagnosis found.   PAST THERAPY:    Interim History:  Patient is here for followup. She is feeling fairly well. She had recent lab work as well as a recent mammogram 11/13 as well as a bone density. The BD showed improved density but still osteopenic range. Mammogram was normal. She is tolerating the tamoxifen, with some joint pains, as well as back pain for which she takes aleve . She does walk and exercise per PT.   Medications: I have reviewed the patient's current medications.  Allergies: No Known Allergies  Past Medical History, Surgical history, Social history, and Family History were reviewed and updated.  Review of Systems: Constitutional:  Negative for fever, chills, night sweats, anorexia, weight loss, pain. Cardiovascular: no chest pain or dyspnea on exertion Respiratory: no cough, shortness of breath, or wheezing Neurological: no TIA or stroke symptoms Dermatological: negative ENT: negative Skin Gastrointestinal: negative Genito-Urinary: negative Hematological and Lymphatic: negative Breast: negative Musculoskeletal: negative Remaining ROS negative.  Physical Exam:  Blood pressure 134/77, pulse 66, temperature 98.1 F (36.7 C), resp. rate 20, height 5\' 3"  (1.6 m), weight 161 lb 4.8 oz (73.165 kg).  ECOG: 0  HEENT:  Sclerae anicteric, conjunctivae pink.  Oropharynx clear.  No mucositis or candidiasis.  Nodes:  No cervical, supraclavicular, or axillary lymphadenopathy palpated.  Breast Exam:  Right breast is benign.  No masses, discharge, skin change, or nipple inversion.  Left breast is status post lumpectomy and radiation. There is some fullness in the central  portion of the breast which I suspect is postoperative in nature..  No masses, discharge, skin change, or nipple inversion..  Lungs:  Clear to auscultation bilaterally.  No crackles, rhonchi, or wheezes.  Heart:  Regular rate and rhythm.  Abdomen:  Soft, nontender.  Positive bowel sounds.  No organomegaly or masses palpated.  Musculoskeletal:  No focal spinal tenderness to palpation.  Extremities:  Benign.  No peripheral edema or cyanosis.  Skin:  Benign.  Neuro:  Nonfocal.     Lab Results: Lab Results  Component Value Date   WBC 7.1 03/13/2012   HGB 12.4 03/13/2012   HCT 36.7 03/13/2012   MCV 89.9 03/13/2012   PLT 171 03/13/2012     Chemistry      Component Value Date/Time   NA 141 03/13/2012 1109   NA 139 09/05/2011 1040   K 4.3 03/13/2012 1109   K 4.1 09/05/2011 1040   CL 106 03/13/2012 1109   CL 106 09/05/2011 1040   CO2 27 03/13/2012 1109   CO2 25 09/05/2011 1040   BUN 18.0 03/13/2012 1109   BUN 12 09/05/2011 1040   CREATININE 0.8 03/13/2012 1109   CREATININE 0.72 09/05/2011 1040      Component Value Date/Time   CALCIUM 9.3 03/13/2012 1109   CALCIUM 9.4 09/05/2011 1040   ALKPHOS 51 03/13/2012 1109   ALKPHOS 44 09/05/2011 1040   AST 16 03/13/2012 1109   AST 15 09/05/2011 1040   ALT <6 03/13/2012 1109   ALT <8 09/05/2011 1040   BILITOT 0.39 03/13/2012 1109   BILITOT 0.3 09/05/2011 1040       Radiological Studies:  No results found.   IMPRESSIONS AND PLAN: A 71 y.o. female with   History  of node-negative ER/PR positive breast cancer on tamoxifen. She seems to be tolerating this fairly well. She is occasional hot flashes, with some low back pain. I have recommended a bone scan and f/u with orthho or primary care. I will plan to see her in followup in 6 months time. Spent more than half the time coordinating care.    Anne Obrien 12/6/20131:28 PM

## 2012-03-30 ENCOUNTER — Encounter (HOSPITAL_COMMUNITY)
Admission: RE | Admit: 2012-03-30 | Discharge: 2012-03-30 | Disposition: A | Discharge status: Home / Self Care | Payer: Medicare Other | Source: Ambulatory Visit | Attending: Oncology | Admitting: Oncology

## 2012-03-30 ENCOUNTER — Encounter (HOSPITAL_COMMUNITY): Payer: Medicare Other

## 2012-03-30 DIAGNOSIS — M549 Dorsalgia, unspecified: Secondary | ICD-10-CM | POA: Insufficient documentation

## 2012-03-30 DIAGNOSIS — C50919 Malignant neoplasm of unspecified site of unspecified female breast: Secondary | ICD-10-CM

## 2012-03-30 MED ORDER — TECHNETIUM TC 99M MEDRONATE IV KIT
23.9000 | PACK | Freq: Once | INTRAVENOUS | Status: AC | PRN
  Administered 2012-03-30: 23.9 via INTRAVENOUS

## 2012-04-24 ENCOUNTER — Telehealth: Payer: Self-pay | Admitting: Oncology

## 2012-04-24 NOTE — Telephone Encounter (Signed)
Pt called and stated she would like to see Dr> Magrinat.  She received her letter and wanted to let someone know.  I emailed Trey Paula Heffelfinger her request.   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  She understands a scheduler will be calling with an appt.

## 2012-06-27 ENCOUNTER — Telehealth: Payer: Self-pay | Admitting: *Deleted

## 2012-06-27 ENCOUNTER — Encounter: Payer: Self-pay | Admitting: Oncology

## 2012-06-27 NOTE — Telephone Encounter (Signed)
Lm gave appt d/t ,,made pt aware that if she had any question to feel free and call Misty Stanley.

## 2012-07-14 ENCOUNTER — Other Ambulatory Visit: Payer: Self-pay | Admitting: Oncology

## 2012-07-14 DIAGNOSIS — C50919 Malignant neoplasm of unspecified site of unspecified female breast: Secondary | ICD-10-CM

## 2012-08-04 ENCOUNTER — Encounter: Payer: Self-pay | Admitting: Cardiology

## 2012-09-15 ENCOUNTER — Telehealth: Payer: Self-pay | Admitting: *Deleted

## 2012-09-15 ENCOUNTER — Ambulatory Visit (HOSPITAL_BASED_OUTPATIENT_CLINIC_OR_DEPARTMENT_OTHER): Payer: Medicare Other | Admitting: Family

## 2012-09-15 ENCOUNTER — Other Ambulatory Visit (HOSPITAL_BASED_OUTPATIENT_CLINIC_OR_DEPARTMENT_OTHER): Payer: Medicare Other | Admitting: Lab

## 2012-09-15 ENCOUNTER — Other Ambulatory Visit: Payer: Medicare Other | Admitting: Lab

## 2012-09-15 ENCOUNTER — Ambulatory Visit: Payer: Medicare Other | Admitting: Oncology

## 2012-09-15 ENCOUNTER — Encounter: Payer: Self-pay | Admitting: Family

## 2012-09-15 VITALS — BP 129/79 | HR 82 | Temp 98.4°F | Resp 20 | Ht 63.0 in | Wt 163.7 lb

## 2012-09-15 DIAGNOSIS — C50919 Malignant neoplasm of unspecified site of unspecified female breast: Secondary | ICD-10-CM

## 2012-09-15 DIAGNOSIS — Z853 Personal history of malignant neoplasm of breast: Secondary | ICD-10-CM

## 2012-09-15 DIAGNOSIS — Z803 Family history of malignant neoplasm of breast: Secondary | ICD-10-CM

## 2012-09-15 LAB — CBC WITH DIFFERENTIAL/PLATELET
Eosinophils Absolute: 0.1 10*3/uL (ref 0.0–0.5)
HGB: 12.9 g/dL (ref 11.6–15.9)
MONO#: 0.5 10*3/uL (ref 0.1–0.9)
NEUT#: 5.4 10*3/uL (ref 1.5–6.5)
Platelets: 175 10*3/uL (ref 145–400)
RBC: 4.32 10*6/uL (ref 3.70–5.45)
RDW: 13 % (ref 11.2–14.5)
WBC: 7.5 10*3/uL (ref 3.9–10.3)

## 2012-09-15 LAB — COMPREHENSIVE METABOLIC PANEL (CC13)
AST: 20 U/L (ref 5–34)
Alkaline Phosphatase: 52 U/L (ref 40–150)
BUN: 18.1 mg/dL (ref 7.0–26.0)
Creatinine: 0.9 mg/dL (ref 0.6–1.1)
Glucose: 117 mg/dl — ABNORMAL HIGH (ref 70–99)
Total Bilirubin: 0.39 mg/dL (ref 0.20–1.20)

## 2012-09-15 MED ORDER — TAMOXIFEN CITRATE 20 MG PO TABS: 20.0000 mg | ORAL_TABLET | Freq: Every day | ORAL | Status: DC

## 2012-09-15 NOTE — Progress Notes (Addendum)
[facility]  [phone] Fax:([phone]  OFFICE PROGRESS NOTE   ID: [name]   DOB: Sep 06, 1940  MR#: [id number]  JYN#:[id number]   PCP: Anne Alken, MD GYN:  SU: Anne Loron, MD RAD ONCRadene Gunning, M.D.   HISTORY OF PRESENT ILLNESS: From Dr. Theron Arista Ob[place]'s new patient evaluation note dated 03/21/2010: "This is a delightful 72 year old woman from [facility] referred by Dr. Dwain [place] for evaluation and treatment of breast cancer. 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  She really had not had any previous screening mammograms.  She had her first mammogram in October 2010.  A six-month followup is recommended because of some calcifications.  In April 2011, a second mammogram was performed, which showed stable calcifications and another six-month followup was recommended, which was performed in October 2011.  Stereotactic biopsies of calcifications were recommended.  A biopsy performed on February 02, 2010 showed fibrocystic changes in the right breast and in the left breast invasive ductal cancer with associated DCIS was seen.  This is ER/PR positive 100% respectively.  HER-2 was negative and proliferative index was 30%.  This patient subsequently had an MRI scan on 02/08/2010 biopsy changes were seen and no other abnormalities were seen.  The patient underwent lumpectomy, sentinel lymph node evaluation on 03/07/2010 final pathology showed 0.5 cm invasive ductal cancer.  A total of eight lymph nodes were removed with sentinel lymph node and these were all negative for malignancy.  Invasive disease was grade 2 with extensive DCIS.  No angiolymphatic invasion was seen.  Resection margins were clear.  The patient has had an unremarkable postoperative course."  Her subsequent history is as detailed below.    INTERVAL HISTORY: Dr. Darnelle [place] and I saw Anne Obrien "Anne Obrien"  today  for followup of invasive ductal carcinoma with extensive ductal carcinoma in situ of the left breast.  The patient was last seen by Dr. Donnie Ob[place] on 03/13/2012.  Since her last office visit, the patient has been doing relatively well.  She is establishing herself with Dr. Darrall Obrien service today.   REVIEW OF SYSTEMS: A 10 point review of systems was completed and is negative except the patient has ongoing occasional leg cramps at night.  The patient also mentions that she has bilateral hand cramping resolves when she takes vitamin B12 but this has not helped her nocturnal leg cramping.  It was suggested that she speak to her primary care physician about the leg cramping.  The patient denies any other symptomatology.   PAST MEDICAL HISTORY: Past Medical History  Diagnosis Date  . Bundle branch block   . Hepatitis A 1976  . Cancer     face  . Breast cancer     stage I left  . Arthritis   . Malaria   . 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  She has a history of left bundle-branch block seen by Dr. Patty Obrien.     PAST SURGICAL HISTORY: Past Surgical History  Procedure Laterality Date  . Vaginal hysterectomy  may 2008    and vaginal repair  . Breast lumpectomy  02/2010    left lumpectomy and sentinel node  . Skin cancer destruction      Nose cryotherapy  Includes hysterectomy, ovaries were left in, this was at the age of 44.   FAMILY HISTORY  Family History  Problem Relation Age of Onset  . Breast cancer Mother   . Arthritis Mother   . Breast cancer Maternal Grandmother   . Breast cancer Maternal Aunt   . Pernicious anemia Father   . Arthritis Sister   . Arthritis Brother   Mother has a history of breast cancer.  Maternal grandmother has a history of breast cancer.  Maternal aunt had some form of malignancy.  Father died at age 68.  She has two brothers and two sisters.  No history of breast  cancer.   GYNECOLOGIC HISTORY: She is G9, P6 with three miscarriages.  She had menarche at age 19.  No history of birth control pill use.    SOCIAL HISTORY: The patient has been married to her husband since 81.  They met while they were in college in [place].  Her husband is  [place] native.  They have 4 adult daughters, 2 adult sons, 3 grandchildren that live in [place], 3 grandchildren that live in [place], and a set of newborn twins that reside in [place], [place]. She is originally from [place], born and brought up in [place], [place]. 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  The patient also notes that her husband has lost his short-term memory.  In her spare time, Anne Obrien enjoys going for walks and spending time with her family.   ADVANCED DIRECTIVES: Not on file  HEALTH MAINTENANCE: History  Substance Use Topics  . Smoking status: Never Smoker   . Smokeless tobacco: Never Used  . Alcohol Use: No    Colonoscopy:  07/2011 PAP: Not on file Bone density: The patient states she had a bone density scan in 01/2012, however we do not have a copy of the scan results Lipid panel: Not on file  No Known Allergies  Current Outpatient Prescriptions  Medication Sig Dispense Refill  . aspirin 81 MG tablet Take 81 mg by mouth as needed.       Marland Kitchen b complex vitamins tablet Take 1 tablet by mouth as needed.        . calcium carbonate (OS-CAL) 600 MG TABS Take 600 mg by mouth 2 (two) times daily with a meal.      . cholecalciferol (VITAMIN D) 1000 UNITS tablet Take 1,000 Units by mouth 2 (two) times daily.      . fish oil-omega-3 fatty acids 1000 MG capsule Take 1 g by mouth daily.        Marland Kitchen glucosamine-chondroitin 500-400 MG tablet Take 1 tablet by mouth daily. Triple strength       . Multiple Vitamin (MULTIVITAMIN) tablet Take 1 tablet by mouth daily.         . tamoxifen (NOLVADEX) 20 MG tablet Take 1 tablet (20 mg total) by mouth daily.  90 tablet  8   No current facility-administered medications for this visit.    OBJECTIVE: Filed Vitals:   09/15/12 1248  BP: 129/79  Pulse: 82  Temp: 98.4 F (36.9 C)  Resp: 20     Body mass index is 29.01 kg/(m^2).      ECOG FS: 1 - Symptomatic but completely ambulatory  General appearance: Alert, cooperative, well nourished, no apparent distress Head: Normocephalic, without obvious abnormality, atraumatic Eyes: Arcus senilis, PERRLA, EOMI Nose: Nares, septum and mucosa are normal, no drainage or sinus tenderness Neck: No adenopathy, supple, symmetrical, trachea midline, thyroid  not enlarged, no tenderness Resp: Clear to auscultation bilaterally Cardio: Regular rate and rhythm, S1, S2 normal, no murmur, click, rub or gallop Breasts: Left breast has well-healed surgical scars, left breast radiation changes noted, no lymphadenopathy, no nipple inversion, no axilla fullness GI: Soft, distended, non-tender, hypoactive bowel sounds, no organomegaly Extremities: Extremities normal, atraumatic, no cyanosis or edema Lymph nodes: Cervical, supraclavicular, and axillary nodes normal Neurologic: Grossly normal    LAB RESULTS: Lab Results  Component Value Date   WBC 7.5 09/15/2012   NEUTROABS 5.4 09/15/2012   HGB 12.9 09/15/2012   HCT 38.2 09/15/2012   MCV 88.5 09/15/2012   PLT 175 09/15/2012      Chemistry      Component Value Date/Time   NA 143 09/15/2012 1236   NA 139 09/05/2011 1040   K 4.1 09/15/2012 1236   K 4.1 09/05/2011 1040   CL 107 09/15/2012 1236   CL 106 09/05/2011 1040   CO2 27 09/15/2012 1236   CO2 25 09/05/2011 1040   BUN 18.1 09/15/2012 1236   BUN 12 09/05/2011 1040   CREATININE 0.9 09/15/2012 1236   CREATININE 0.72 09/05/2011 1040      Component Value Date/Time   CALCIUM 9.2 09/15/2012 1236   CALCIUM 9.4 09/05/2011 1040   ALKPHOS 52 09/15/2012 1236   ALKPHOS 44 09/05/2011 1040   AST  20 09/15/2012 1236   AST 15 09/05/2011 1040   ALT 7 09/15/2012 1236   ALT <8 09/05/2011 1040   BILITOT 0.39 09/15/2012 1236   BILITOT 0.3 09/05/2011 1040       Lab Results  Component Value Date   LABCA2 17 09/05/2011    Urinalysis No results found for this basename: colorurine,  appearanceur,  labspec,  phurine,  glucoseu,  hgbur,  bilirubinur,  ketonesur,  proteinur,  urobilinogen,  nitrite,  leukocytesur    STUDIES: No results found.  ASSESSMENT: 72 y.o. [place]:  1.  Status post right breast needle core biopsy in the retroareolar region and left breast needle core biopsy in the upper outer quadrant on 02/02/2010 which showed, the right breast had fibrocystic changes with usual ductal hyperplasia without atypia and rare calcifications without malignancy identified.  The left breast needle core biopsy showed invasive ductal carcinoma with ductal carcinoma in situ and fibrocystic changes with calcifications, estrogen receptor 100%, progesterone receptor 100%, Ki-67 30%, HER-2/neu by CISH no amplification with a ratio of 1.29.  2.  Status post bilateral breast MRI on 02/08/2010 which showed mild-to-moderate normal background parenchymal enhancement was noted several scattered foci bilaterally are noted.  No enlarged or abnormal appearing lymph nodes were identified.  No bony or upper abdominal abnormalities were noted.  Biopsy changes within the upper inner right breast were biopsy proven to be benign.  Biopsy changes in the left upper outer left breast showed no evidence of multifocal, multicentric or contralateral neoplasm.  No enlarged or abnormal appearing lymph nodes.  3.  Status post left breast lumpectomy with left axillary sentinel node biopsy on 03/07/2010 for a stage I, pT1a pN0 (i-) (sn-), 0.5 cm invasive ductal carcinoma with extensive ductal carcinoma in situ grade 2, estrogen receptor 100% positive, progesterone receptor 100% positive, Ki-67 30%, HER-2/neu  by CISH no amplification with a ratio of 1.29, 0/8 metastatic left axillary lymph nodes.  4.  The patient underwent genetic counseling and testing with the report dated 04/10/2010 which showed the patient was negative for BRCA1 and BRCA2 gene mutations.  5.  The patient  underwent radiation therapy from 04/11/2010 to 05/09/2010.  6.  The patient started antiestrogen therapy with Tamoxifen in 05/2010.  7.  The patient's last bilateral digital diagnostic mammogram on 02/14/2012 showed no mammographic evidence of local recurrence, left breast.  No mammographic evidence of malignancy, right breast.  8.  The patient underwent a whole-body bone scan on 03/30/2012 which showed arthritic changes in the thoracolumbar spine.  Relatively intense uptake involving the bone surrounding the right elbow joint.  No convincing evidence of lytic bone lesion in the thoracic or lumbar spine.  9.  Nocturnal bilateral leg cramping  PLAN: We plan to see Anne [place] again in 06/2013.  We plan to continue to see her annually until 06/2015 at which time she will have had antiestrogen therapy with Tamoxifen for 5 years and she will be eligible to graduate [facility] breast cancer program.  At her next office visit we will check a CBC, CMP, LDH, and vitamin D level.  We will also schedule the patient's bilateral digital diagnostic mammogram for her in 02/2013.  The patient was asked to discuss her nocturnal leg cramping with her primary care physician.  All questions were answered.  The patient was encouraged to contact us in the interim with any problems, questions or concerns.   Larina Bras, NP-C 09/16/2012, 10:22 AM  ADDENDUM: "[place]" is establishing herself in my practice today. She is a 71 year-old BRCA negative [place] woman who underwent a left lumpectomy and sentinel lymph node sampling November of 2011 for a pT1a pN0, stage IA invasive ductal carcinoma, in the setting of extensive ductal carcinoma in situ. The  invasive tumor was estrogen and progesterone receptor positive, with HER-2 not amplified, and an MIB-1 of 30%. After completing adjuvant radiation February of 2012 she started tamoxifen, on which she continues with good tolerance.  I gave the patient a written a count of her diagnosis prognosis and treatment plan. She understands that tumors as small as hers seldom recur after local treatment. Adjuvant systemic therapy is optional. However by taking tamoxifen she is also cutting in half her risk of developing a new breast cancer in either breast. We consider switching to an aromatase inhibitor, but in the setting of the patient's excellent prognosis I am comfortable continuing tamoxifen to total of 5 years and then discontinuing followup here.  The patient will see [place] again March of next year, after her scheduled mammogram. She knows to call for any problems that may develop before her next visit here.   I personally saw this patient and performed a substantive portion of this encounter with the listed APP documented above.   Lowella Dell, MD

## 2012-09-15 NOTE — Patient Instructions (Addendum)
Please contact us at [phone] if you have any questions or concerns.  Please continue to do well and enjoy life!!!  Get plenty of rest, drink plenty of water, exercise daily (walking), eat a balanced diet.  Continue to take calcium and vitamin D3 1000 IUs daily.   Results for orders placed in visit on 09/15/12 (from the past 24 hour(s))  CBC WITH DIFFERENTIAL     Status: None   Collection Time    09/15/12 12:36 PM      Result Value Range   WBC 7.5  3.9 - 10.3 10e3/uL   NEUT# 5.4  1.5 - 6.5 10e3/uL   HGB 12.9  11.6 - 15.9 g/dL   HCT 21.3  08.6 - 57.8 %   Platelets 175  145 - 400 10e3/uL   MCV 88.5  79.5 - 101.0 fL   MCH 29.9  25.1 - 34.0 pg   MCHC 33.8  31.5 - 36.0 g/dL   RBC 4.69  6.29 - 5.28 10e6/uL   RDW 13.0  11.2 - 14.5 %   lymph# 1.5  0.9 - 3.3 10e3/uL   MONO# 0.5  0.1 - 0.9 10e3/uL   Eosinophils Absolute 0.1  0.0 - 0.5 10e3/uL   Basophils Absolute 0.1  0.0 - 0.1 10e3/uL   NEUT% 71.9  38.4 - 76.8 %   LYMPH% 19.4  14.0 - 49.7 %   MONO% 6.5  0.0 - 14.0 %   EOS% 1.5  0.0 - 7.0 %   BASO% 0.7  0.0 - 2.0 %   Narrative:    Performed At:  [facility]               [address]               [place], [place] [address]  COMPREHENSIVE METABOLIC PANEL (CC13)     Status: Abnormal   Collection Time    09/15/12 12:36 PM      Result Value Range   Sodium 143  136 - 145 mEq/L   Potassium 4.1  3.5 - 5.1 mEq/L   Chloride 107  98 - 107 mEq/L   CO2 27  22 - 29 mEq/L   Glucose 117 (*) 70 - 99 mg/dl   BUN 40.1  7.0 - 02.7 mg/dL   Creatinine 0.9  0.6 - 1.1 mg/dL   Total Bilirubin 2.53  0.20 - 1.20 mg/dL   Alkaline Phosphatase 52  40 - 150 U/L   AST 20  5 - 34 U/L   ALT 7  0 - 55 U/L   Total Protein 6.5  6.4 - 8.3 g/dL   Albumin 3.5  3.5 - 5.0 g/dL   Calcium 9.2  8.4 - 66.4 mg/dL   Narrative:    Nonfasting Glucose expected range = less than 140.Performed At:  [facility]               [address]               [place], [place] [address]    Nm Bone  Scan Whole Body 03/30/2012   *RADIOLOGY REPORT*  Clinical Data: Breast cancer.  Back pain.  NUCLEAR MEDICINE WHOLE BODY BONE SCINTIGRAPHY  Technique:  Whole body anterior and posterior images were obtained approximately 3 hours after intravenous injection of radiopharmaceutical.  Radiopharmaceutical: 23. CURIE TC-MDP TECHNETIUM TC 63M MEDRONATE IV KIT  Comparison: None.  Findings: There is vaguely increased activity to the left of midline  in the mid thoracic spine and to the left midline at the L4- 5 lumbar level.  Slight scoliosis of the thoracolumbar spine.  No intense focus of radiotracer uptake in the spine is present.  There is uptake about the shoulder joints, knees, and midfoot likely arthritic in nature.  There is relatively intense uptake of radiotracer in the right elbow joint region.  Injection was in the left antecubital fossa. This involves both the distal radius, proximal ulna, and proximal radius.  Renal uptake is within normal limits.  Skull and pelvis are within normal limits.  IMPRESSION: Arthritic changes in the thoracolumbar spine.  Relatively intense uptake involving the bones surrounding the right elbow joint.  An inflammatory process is not excluded.  Plain radiographs are recommended as the initial next.  No convincing evidence of lytic bone lesion in the thoracic or lumbar spine.   Original Report Authenticated By: Jolaine Click, M.D.

## 2012-09-15 NOTE — Telephone Encounter (Signed)
appts was made and printed...td 

## 2013-01-21 ENCOUNTER — Emergency Department (HOSPITAL_COMMUNITY): Payer: Medicare Other

## 2013-01-21 ENCOUNTER — Inpatient Hospital Stay (HOSPITAL_COMMUNITY)
Admission: EM | Admit: 2013-01-21 | Discharge: 2013-01-23 | DRG: 392 | Disposition: A | Discharge status: Home / Self Care | Payer: Medicare Other | Attending: Internal Medicine | Admitting: Internal Medicine

## 2013-01-21 ENCOUNTER — Observation Stay (HOSPITAL_COMMUNITY): Payer: Medicare Other

## 2013-01-21 ENCOUNTER — Encounter (HOSPITAL_COMMUNITY): Payer: Self-pay | Admitting: Emergency Medicine

## 2013-01-21 DIAGNOSIS — A088 Other specified intestinal infections: Secondary | ICD-10-CM | POA: Diagnosis present

## 2013-01-21 DIAGNOSIS — Z79810 Long term (current) use of selective estrogen receptor modulators (SERMs): Secondary | ICD-10-CM

## 2013-01-21 DIAGNOSIS — M899 Disorder of bone, unspecified: Secondary | ICD-10-CM | POA: Diagnosis present

## 2013-01-21 DIAGNOSIS — Z8613 Personal history of malaria: Secondary | ICD-10-CM

## 2013-01-21 DIAGNOSIS — R079 Chest pain, unspecified: Secondary | ICD-10-CM | POA: Diagnosis present

## 2013-01-21 DIAGNOSIS — R112 Nausea with vomiting, unspecified: Principal | ICD-10-CM | POA: Diagnosis present

## 2013-01-21 DIAGNOSIS — Z85828 Personal history of other malignant neoplasm of skin: Secondary | ICD-10-CM

## 2013-01-21 DIAGNOSIS — Z923 Personal history of irradiation: Secondary | ICD-10-CM

## 2013-01-21 DIAGNOSIS — C50919 Malignant neoplasm of unspecified site of unspecified female breast: Secondary | ICD-10-CM

## 2013-01-21 DIAGNOSIS — Z79899 Other long term (current) drug therapy: Secondary | ICD-10-CM

## 2013-01-21 DIAGNOSIS — N390 Urinary tract infection, site not specified: Secondary | ICD-10-CM | POA: Diagnosis present

## 2013-01-21 DIAGNOSIS — R0789 Other chest pain: Secondary | ICD-10-CM | POA: Diagnosis present

## 2013-01-21 DIAGNOSIS — R197 Diarrhea, unspecified: Secondary | ICD-10-CM | POA: Diagnosis present

## 2013-01-21 DIAGNOSIS — Z7982 Long term (current) use of aspirin: Secondary | ICD-10-CM

## 2013-01-21 DIAGNOSIS — I4510 Unspecified right bundle-branch block: Secondary | ICD-10-CM | POA: Diagnosis present

## 2013-01-21 LAB — POCT I-STAT TROPONIN I: Troponin i, poc: 0.01 ng/mL (ref 0.00–0.08)

## 2013-01-21 LAB — BASIC METABOLIC PANEL
Calcium: 9.2 mg/dL (ref 8.4–10.5)
Creatinine, Ser: 0.63 mg/dL (ref 0.50–1.10)
GFR calc Af Amer: >90 mL/min (ref >90)

## 2013-01-21 LAB — CBC
Hemoglobin: 14.8 g/dL (ref 12.0–15.0)
MCH: 30.8 pg (ref 26.0–34.0)
MCV: 85.8 fL (ref 78.0–100.0)
Platelets: 205 10*3/uL (ref 150–400)
RDW: 12.6 % (ref 11.5–15.5)

## 2013-01-21 MED ORDER — ASPIRIN 81 MG PO CHEW
324.0000 mg | CHEWABLE_TABLET | Freq: Once | ORAL | Status: AC
  Administered 2013-01-21: 324 mg via ORAL
  Filled 2013-01-21: qty 4

## 2013-01-21 MED ORDER — MORPHINE SULFATE 2 MG/ML IJ SOLN
2.0000 mg | Freq: Once | INTRAMUSCULAR | Status: AC
  Administered 2013-01-21: 2 mg via INTRAVENOUS
  Filled 2013-01-21: qty 1

## 2013-01-21 MED ORDER — SODIUM CHLORIDE 0.9 % IV SOLN
Freq: Once | INTRAVENOUS | Status: AC
  Administered 2013-01-21: 23:00:00 via INTRAVENOUS

## 2013-01-21 MED ORDER — ONDANSETRON HCL 4 MG/2ML IJ SOLN
4.0000 mg | Freq: Once | INTRAMUSCULAR | Status: AC
  Administered 2013-01-21: 4 mg via INTRAVENOUS
  Filled 2013-01-21: qty 2

## 2013-01-21 NOTE — ED Provider Notes (Signed)
72 year old female, history of  Past Medical History  Diagnosis Date  . Bundle branch block   . Hepatitis A 1976  . Cancer     face  . Breast cancer     stage I left  . Arthritis   . Malaria   . Osteopenia    Incomplete right bundle-branch block, breast cancer, chronic malaria. She presents with a complaint of chest pain. She describes this as a heaviness, it started this morning at 4:00, it has been intermittent throughout the day, it is substernal, nothing seems to make it better or worse, she has not done any specific activity today including no exertional activity. She has no history of significant chest pain and in fact had a normal stress test within the last 5 years according to her report.    On my exam the patient is clear heart sounds, clear lung sounds, no murmur, no peripheral edema, no tachycardia. Her EKG shows an incomplete right bundle branch block with borderline Q waves. There is no acute ST elevation or depression to suggest acute ischemia. Troponin is normal, CBC shows a leukocytosis. Chest x-ray pending, patient would likely benefit from admission for a cardiac rule out and she is having ongoing chest pain. She was sent from the clinic at [facility] for further evaluation and possible admission.  Cardiologist is Dr. Patty Sermons  D/w Dr. Toniann Fail for admission - holding orders written  Medical screening examination/treatment/procedure(s) were conducted as a shared visit with non-physician practitioner(s) and myself.  I personally evaluated the patient during the encounter.  Clinical Impression: chest pain      Vida Roller, MD 01/21/13 [phone]

## 2013-01-21 NOTE — ED Provider Notes (Signed)
CSN: [id number]     Arrival date & time 01/21/13  2127 History   First MD Initiated Contact with Patient 01/21/13 2209     Chief Complaint  Patient presents with  . Chest Pain   (Consider location/radiation/quality/duration/timing/severity/associated sxs/prior Treatment) HPI Comments: Mrs [place], a 72 year old female, patients with a history of Right Bundle Branch Block presents with a chief complaint of chest discomfort. She describes the discomfort as intermittent pressure lasting 30 minutes without radiation and is not pleuritic.  Reports abrupt onset while sleeping at 0400 today. Reports taking an antacid without relief and diarrhea started at 0600.  States 4 episodes of vomiting today. And relates diarrhea and contributing it to "chronic malaria".  She reports no relief with home nitro and spent most of the day in bed.  Reports subjective fever and chills.  She reports last stress test was in 2010 and was ordered due to a RBBB found on a physical EKG. She reports going to her walk in clinic to day and was told to come to the ED. Denies taking ASA 81 mg today. Denies hematuria, lower extremity pain, or dyspnea.   Patient is a 72 y.o. female presenting with chest pain. The history is provided by the patient.  Chest Pain   Past Medical History  Diagnosis Date  . Bundle branch block   . Hepatitis A 1976  . Cancer     face  . Breast cancer     stage I left  . Arthritis   . Malaria   . Osteopenia    Past Surgical History  Procedure Laterality Date  . Vaginal hysterectomy  may 2008    and vaginal repair  . Breast lumpectomy  02/2010    left lumpectomy and sentinel node  . Skin cancer destruction      Nose cryotherapy   Family History  Problem Relation Age of Onset  . Breast cancer Mother   . Arthritis Mother   . Breast cancer Maternal Grandmother   . Breast cancer Maternal Aunt   . Pernicious anemia Father   . Arthritis Sister   . Arthritis Brother    History  Substance  Use Topics  . Smoking status: Never Smoker   . Smokeless tobacco: Never Used  . Alcohol Use: No   OB History   Grav Para Term Preterm Abortions TAB SAB Ect Mult Living                 Review of Systems  Cardiovascular: Positive for chest pain.  All other systems reviewed and are negative.    Allergies  Review of patient's allergies indicates no known allergies.  Home Medications   Current Outpatient Rx  Name  Route  Sig  Dispense  Refill  . aspirin 81 MG tablet   Oral   Take 81 mg by mouth as needed.          Marland Kitchen b complex vitamins tablet   Oral   Take 1 tablet by mouth as needed.           . calcium carbonate (OS-CAL) 600 MG TABS   Oral   Take 600 mg by mouth 2 (two) times daily with a meal.         . cholecalciferol (VITAMIN D) 1000 UNITS tablet   Oral   Take 1,000 Units by mouth 2 (two) times daily.         . fish oil-omega-3 fatty acids 1000 MG capsule   Oral  Take 1 g by mouth daily.           Marland Kitchen glucosamine-chondroitin 500-400 MG tablet   Oral   Take 1 tablet by mouth daily. Triple strength          . Multiple Vitamin (MULTIVITAMIN) tablet   Oral   Take 1 tablet by mouth daily.           . tamoxifen (NOLVADEX) 20 MG tablet   Oral   Take 1 tablet (20 mg total) by mouth daily.   90 tablet   8     Dr. Donnie Coffin no longer with this practice.  Today's r ...    BP 136/78  Pulse 97  Temp(Src) 98.6 F (37 C) (Oral)  Resp 20  Ht 5\' 3"  (1.6 m)  Wt 160 lb 6 oz (72.746 kg)  BMI 28.42 kg/m2  SpO2 98% Physical Exam  Nursing note and vitals reviewed. Constitutional: Vital signs are normal. She appears well-developed and well-nourished. No distress.  HENT:  Head: Normocephalic and atraumatic.  Eyes: EOM are normal. No scleral icterus.  Neck: Neck supple.  Cardiovascular: Normal rate and regular rhythm.   No murmur heard. Pulmonary/Chest: Effort normal and breath sounds normal. She has no wheezes. She has no rales.  Sternum is tender to  palpation.  Abdominal: Soft. Bowel sounds are normal. There is tenderness in the epigastric area. There is no rebound, no guarding and no CVA tenderness.  Musculoskeletal:  No lower extremity edema  Neurological: She is alert.  Skin: Skin is warm and dry. She is not diaphoretic.    ED Course  Procedures (including critical care time) Labs Review Labs Reviewed  CBC - Abnormal; Notable for the following:    WBC 15.1 (*)    All other components within normal limits  BASIC METABOLIC PANEL - Abnormal; Notable for the following:    Glucose, Bld 158 (*)    GFR calc non Af Amer 87 (*)    All other components within normal limits  POCT I-STAT TROPONIN I   Imaging Review No results found.  EKG Interpretation     Ventricular Rate:  100 PR Interval:  142 QRS Duration: 104 QT Interval:  378 QTC Calculation: 487 R Axis:   -66 Text Interpretation:  Normal sinus rhythm Incomplete right bundle branch block Left anterior fascicular block Lateral infarct , age undetermined Possible Inferior infarct , age undetermined Abnormal ECG No old tracing to compare            MDM   1. Chest pain    The patient presents with a past medical history of RBBB with substernal pressure without radiation.  She reports several GI symptoms as well.  EKG without acute findings.  Will order CBC, CMP, Chest XR, and Troponin.  Will order ASA 325 chewed.  Discussed patient with Dr. Hyacinth Meeker, and will admit for observation overnight.   Clabe Seal, PA-C 01/22/13 0030

## 2013-01-21 NOTE — ED Notes (Signed)
MD at bedside. 

## 2013-01-21 NOTE — ED Notes (Signed)
Epigastric pain - pressure. Started 0400; drank water and antacid ~ 0500 no relief; 0600 x 1 sl. Ntg. 0.4 mg with no relief. Went to [facility]

## 2013-01-22 ENCOUNTER — Encounter (HOSPITAL_COMMUNITY): Payer: Self-pay | Admitting: Internal Medicine

## 2013-01-22 DIAGNOSIS — I369 Nonrheumatic tricuspid valve disorder, unspecified: Secondary | ICD-10-CM

## 2013-01-22 DIAGNOSIS — R079 Chest pain, unspecified: Secondary | ICD-10-CM | POA: Diagnosis present

## 2013-01-22 LAB — HEPATIC FUNCTION PANEL
AST: 17 U/L (ref 0–37)
Alkaline Phosphatase: 51 U/L (ref 39–117)
Bilirubin, Direct: 0.1 mg/dL (ref 0.0–0.3)
Indirect Bilirubin: 0.3 mg/dL (ref 0.3–0.9)
Total Bilirubin: 0.4 mg/dL (ref 0.3–1.2)

## 2013-01-22 LAB — MALARIA SMEAR

## 2013-01-22 LAB — URINALYSIS, ROUTINE W REFLEX MICROSCOPIC
Glucose, UA: NEGATIVE mg/dL
Hgb urine dipstick: NEGATIVE
Ketones, ur: 15 mg/dL — AB
Nitrite: NEGATIVE
pH: 6 (ref 5.0–8.0)

## 2013-01-22 LAB — D-DIMER, QUANTITATIVE: D-Dimer, Quant: <0.27 ug/mL-FEU (ref 0.00–0.48)

## 2013-01-22 LAB — CBC
Hemoglobin: 13.2 g/dL (ref 12.0–15.0)
MCH: 30.3 pg (ref 26.0–34.0)
MCHC: 34.9 g/dL (ref 30.0–36.0)
Platelets: 189 10*3/uL (ref 150–400)
RBC: 4.35 MIL/uL (ref 3.87–5.11)
RDW: 12.7 % (ref 11.5–15.5)

## 2013-01-22 LAB — URINE MICROSCOPIC-ADD ON

## 2013-01-22 LAB — TROPONIN I
Troponin I: <0.3 ng/mL (ref <0.30)
Troponin I: <0.3 ng/mL (ref <0.30)
Troponin I: <0.3 ng/mL (ref <0.30)

## 2013-01-22 LAB — CREATININE, SERUM
Creatinine, Ser: 0.66 mg/dL (ref 0.50–1.10)
GFR calc non Af Amer: 86 mL/min — ABNORMAL LOW (ref >90)

## 2013-01-22 MED ORDER — OMEGA-3-ACID ETHYL ESTERS 1 G PO CAPS
1.0000 g | ORAL_CAPSULE | Freq: Every day | ORAL | Status: DC
  Administered 2013-01-22 – 2013-01-23 (×2): 1 g via ORAL
  Filled 2013-01-22 (×2): qty 1

## 2013-01-22 MED ORDER — SODIUM CHLORIDE 0.9 % IV SOLN
INTRAVENOUS | Status: AC
  Administered 2013-01-22: 02:00:00 via INTRAVENOUS

## 2013-01-22 MED ORDER — ENOXAPARIN SODIUM 40 MG/0.4ML ~~LOC~~ SOLN
40.0000 mg | Freq: Every day | SUBCUTANEOUS | Status: DC
  Administered 2013-01-22 – 2013-01-23 (×2): 40 mg via SUBCUTANEOUS
  Filled 2013-01-22 (×2): qty 0.4

## 2013-01-22 MED ORDER — VITAMIN D3 25 MCG (1000 UNIT) PO TABS
1000.0000 [IU] | ORAL_TABLET | Freq: Two times a day (BID) | ORAL | Status: DC
Start: 2013-01-22 — End: 2013-01-23
  Administered 2013-01-22 – 2013-01-23 (×3): 1000 [IU] via ORAL
  Filled 2013-01-22 (×4): qty 1

## 2013-01-22 MED ORDER — NITROGLYCERIN 0.4 MG SL SUBL: 0.4000 mg | SUBLINGUAL_TABLET | SUBLINGUAL | Status: DC | PRN

## 2013-01-22 MED ORDER — TAMOXIFEN CITRATE 10 MG PO TABS
20.0000 mg | ORAL_TABLET | Freq: Every day | ORAL | Status: DC
  Administered 2013-01-22 – 2013-01-23 (×2): 20 mg via ORAL
  Filled 2013-01-22 (×2): qty 2

## 2013-01-22 MED ORDER — ACETAMINOPHEN 325 MG PO TABS: 650.0000 mg | ORAL_TABLET | ORAL | Status: DC | PRN

## 2013-01-22 MED ORDER — PROMETHAZINE HCL 25 MG/ML IJ SOLN
12.5000 mg | Freq: Four times a day (QID) | INTRAMUSCULAR | Status: DC | PRN
  Administered 2013-01-22: 12.5 mg via INTRAVENOUS
  Filled 2013-01-22: qty 1

## 2013-01-22 MED ORDER — ASPIRIN EC 325 MG PO TBEC
325.0000 mg | DELAYED_RELEASE_TABLET | Freq: Every day | ORAL | Status: DC
  Administered 2013-01-22 – 2013-01-23 (×2): 325 mg via ORAL
  Filled 2013-01-22 (×3): qty 1

## 2013-01-22 MED ORDER — OMEGA-3 FATTY ACIDS 1000 MG PO CAPS: 1.0000 g | ORAL_CAPSULE | Freq: Every day | ORAL | Status: DC

## 2013-01-22 MED ORDER — DEXTROSE 5 % IV SOLN
1.0000 g | INTRAVENOUS | Status: DC
  Administered 2013-01-22: 1 g via INTRAVENOUS
  Filled 2013-01-22 (×2): qty 10

## 2013-01-22 MED ORDER — ONDANSETRON HCL 4 MG/2ML IJ SOLN
4.0000 mg | Freq: Four times a day (QID) | INTRAMUSCULAR | Status: DC | PRN
  Administered 2013-01-22: 4 mg via INTRAVENOUS
  Filled 2013-01-22: qty 2

## 2013-01-22 MED ORDER — MORPHINE SULFATE 2 MG/ML IJ SOLN: 2.0000 mg | INTRAMUSCULAR | Status: DC | PRN

## 2013-01-22 MED ORDER — ONDANSETRON HCL 4 MG/2ML IJ SOLN: 4.0000 mg | Freq: Three times a day (TID) | INTRAMUSCULAR | Status: DC | PRN

## 2013-01-22 NOTE — ED Provider Notes (Signed)
Medical screening examination/treatment/procedure(s) were conducted as a shared visit with non-physician practitioner(s) and myself.  I personally evaluated the patient during the encounter  Please see my separate respective documentation pertaining to this patient encounter   Vida Roller, MD 01/22/13 [phone]

## 2013-01-22 NOTE — Progress Notes (Signed)
Echocardiogram 2D Echocardiogram has been performed.  01/22/2013 3:23 PM Gertie Fey, RVT, RDCS, RDMS

## 2013-01-22 NOTE — ED Notes (Signed)
Report to [facility] on [address].  Pt to go to floor with EMT on monitor.

## 2013-01-22 NOTE — Progress Notes (Signed)
Patient seen earlier today by my colleague Dr. Toniann Fail. Patient seen examined, and data base reviewed. Patient came in with nausea/vomiting and chest pain. Patient reported that she has "chronic malaria", his low-grade fever, 1 peripheral blood smear for malaria is negative. Urine is weakly positive for UTI started her on Rocephin. I will repeat peripheral blood smear for malaria (thin and thick films).  Clint Lipps Pager: [phone] 01/22/2013, 6:05 PM

## 2013-01-22 NOTE — Progress Notes (Signed)
Utilization review complete 

## 2013-01-22 NOTE — H&P (Signed)
[facility] History and Physical  Anne Obrien ZOX:[id number] DOB: [birth year] DOA: 01/21/2013  Referring physician: ER physician. PCP: Gaye Alken, MD   Chief Complaint: Chest pain.  HPI: Anne Obrien is a 72 y.o. female with history of breast cancer status post lumpectomy and radiation presently on tamoxifen presented to the ER because of persistent chest pain. The pain is retrosternal nonradiating pressure-like. Patient's symptoms started early in the morning yesterday which woke her up from sleep. She initially took some antacids with minimal relief. Subsequently she had some nitroglycerin which Dr. Patty Sermons had previously given which also did not relieve her much. Later she had nausea vomiting and diarrhea. Denies having taken any recent antibiotics. Patient did not have any abdominal pain. Since the pain persisted later went to her PCP in the evening and was referred to the ER. EKG done in the PCPs office was showing RBBB. Patient does have known history of RBBB. In the ER cardiac markers were negative. Patient's symptoms improved with morphine and presently is chest pain-free.   Review of Systems: As presented in the history of presenting illness, rest negative.  Past Medical History  Diagnosis Date  . Bundle branch block   . Hepatitis A 1976  . Cancer     face  . Breast cancer     stage I left  . Arthritis   . Malaria   . Osteopenia    Past Surgical History  Procedure Laterality Date  . Vaginal hysterectomy  may 2008    and vaginal repair  . Breast lumpectomy  02/2010    left lumpectomy and sentinel node  . Skin cancer destruction      Nose cryotherapy   Social History:  reports that she has never smoked. She has never used smokeless tobacco. She reports that she does not drink alcohol or use illicit drugs. Where does patient live home.  Can patient participate in ADLs? yes.   No Known Allergies  Family History:  Family History   Problem Relation Age of Onset  . Breast cancer Mother   . Arthritis Mother   . Breast cancer Maternal Grandmother   . Breast cancer Maternal Aunt   . Pernicious anemia Father   . Arthritis Sister   . Arthritis Brother       Prior to Admission medications   Medication Sig Start Date End Date Taking? Authorizing Provider  aspirin 81 MG tablet Take 81 mg by mouth as needed.    Yes Historical Provider, MD  b complex vitamins tablet Take 1 tablet by mouth as needed.     Yes Historical Provider, MD  calcium carbonate (OS-CAL) 600 MG TABS Take 600 mg by mouth 2 (two) times daily with a meal.   Yes Historical Provider, MD  cholecalciferol (VITAMIN D) 1000 UNITS tablet Take 1,000 Units by mouth 2 (two) times daily.   Yes Historical Provider, MD  fish oil-omega-3 fatty acids 1000 MG capsule Take 1 g by mouth daily.     Yes Historical Provider, MD  glucosamine-chondroitin 500-400 MG tablet Take 1 tablet by mouth daily. Triple strength    Yes Historical Provider, MD  Multiple Vitamin (MULTIVITAMIN) tablet Take 1 tablet by mouth daily.     Yes Historical Provider, MD  tamoxifen (NOLVADEX) 20 MG tablet Take 1 tablet (20 mg total) by mouth daily. 09/15/12  Yes Keitha Butte, NP    Physical Exam: Filed Vitals:   01/21/13 2134 01/21/13 2147 01/21/13 2255  BP: 136/78  144/64  Pulse: 97    Temp: 98.6 F (37 C)  98.5 F (36.9 C)  TempSrc: Oral  Oral  Resp: 20  18  Height:  5\' 3"  (1.6 m)   Weight:  72.746 kg (160 lb 6 oz)   SpO2: 98%  99%     General:  Well-developed and nourished.   Eyes: Anicteric no pallor.   ENT: No discharge from ears eyes nose mouth.   Neck: No mass felt.   Cardiovascular: S1-S2 heard.   Respiratory: No rhonchi or crepitations.   Abdomen: Soft nontender bowel sounds present.   Skin: No rash.   Musculoskeletal: No edema.   Psychiatric: Appears normal.   Neurologic: Alert awake oriented to time place and person. Moves all extremities.   Labs on  Admission:  Basic Metabolic Panel:  Recent Labs Lab 01/21/13 2155  NA 137  K 3.5  CL 100  CO2 26  GLUCOSE 158*  BUN 15  CREATININE 0.63  CALCIUM 9.2   Liver Function Tests: No results found for this basename: AST, ALT, ALKPHOS, BILITOT, PROT, ALBUMIN,  in the last 168 hours No results found for this basename: LIPASE, AMYLASE,  in the last 168 hours No results found for this basename: AMMONIA,  in the last 168 hours CBC:  Recent Labs Lab 01/21/13 2155  WBC 15.1*  HGB 14.8  HCT 41.2  MCV 85.8  PLT 205   Cardiac Enzymes: No results found for this basename: CKTOTAL, CKMB, CKMBINDEX, TROPONINI,  in the last 168 hours  BNP (last 3 results) No results found for this basename: PROBNP,  in the last 8760 hours CBG: No results found for this basename: GLUCAP,  in the last 168 hours  Radiological Exams on Admission: Dg Chest 2 View  01/22/2013   CLINICAL DATA:  Intermittent chest pain.  Breast cancer.  EXAM: CHEST  2 VIEW  COMPARISON:  11/20/2009  FINDINGS: The heart size and mediastinal contours are within normal limits. Both lungs are clear. The visualized skeletal structures are unremarkable. Surgical clips overlie the left breast  IMPRESSION: No active cardiopulmonary disease.  Stable appearance from priors.   Electronically Signed   By: Davonna Belling M.D.   On: 01/22/2013 00:36    EKG: Independently reviewed. Normal sinus rhythm with incomplete right bundle branch block.   Assessment/Plan Principal Problem:   Chest pain   1. Chest pain - present the chest pain-free after morphine. We will cycle correct markers. Check d-dimer since patient is on tamoxifen. Check 2-D echo. 2. Nausea vomiting and diarrhea - probably viral. If there is further diarrhea then may need stool studies. Presently abdomen appears benign. Check LFTs. Patient states that she has had recurrent malaise as she had stayed extensively in [place]. The last time she was in [place] was in 2010. We'll check smear  for malaria. 3. Leukocytosis  - probably reactionary. Follow CBC. Check urinalysis. 4. History of breast cancer status post lumpectomy and radiation on tamoxifen.    Code Status: Full code.  Family Communication: Patient's daughter at the bedside.  Disposition Plan: Admit for observation.    KAKRAKANDY,ARSHAD N. [facility] Pager [phone].  If 7PM-7AM, please contact night-coverage [email or web address] Password [email or web address] 01/22/2013, 1:07 AM

## 2013-01-23 DIAGNOSIS — R112 Nausea with vomiting, unspecified: Secondary | ICD-10-CM | POA: Diagnosis present

## 2013-01-23 DIAGNOSIS — C50919 Malignant neoplasm of unspecified site of unspecified female breast: Secondary | ICD-10-CM

## 2013-01-23 DIAGNOSIS — N390 Urinary tract infection, site not specified: Secondary | ICD-10-CM

## 2013-01-23 LAB — CBC
HCT: 36.4 % (ref 36.0–46.0)
Hemoglobin: 12.4 g/dL (ref 12.0–15.0)
MCH: 30.1 pg (ref 26.0–34.0)
MCHC: 34.1 g/dL (ref 30.0–36.0)
MCV: 88.3 fL (ref 78.0–100.0)
WBC: 7.8 10*3/uL (ref 4.0–10.5)

## 2013-01-23 LAB — BASIC METABOLIC PANEL
BUN: 12 mg/dL (ref 6–23)
CO2: 26 mEq/L (ref 19–32)
Chloride: 108 mEq/L (ref 96–112)
Glucose, Bld: 91 mg/dL (ref 70–99)
Potassium: 3.3 mEq/L — ABNORMAL LOW (ref 3.5–5.1)

## 2013-01-23 LAB — URINE CULTURE: Colony Count: >=100000

## 2013-01-23 MED ORDER — CIPROFLOXACIN HCL 500 MG PO TABS: 500.0000 mg | ORAL_TABLET | Freq: Two times a day (BID) | ORAL | Status: DC

## 2013-01-23 MED ORDER — POTASSIUM CHLORIDE CRYS ER 20 MEQ PO TBCR
60.0000 meq | EXTENDED_RELEASE_TABLET | Freq: Once | ORAL | Status: AC
  Administered 2013-01-23: 60 meq via ORAL
  Filled 2013-01-23: qty 3

## 2013-01-23 NOTE — Discharge Summary (Signed)
Physician Discharge Summary  Anne Obrien UXL:[id number] DOB: [birth year] DOA: 01/21/2013  PCP: Anne Obrien  Admit date: 01/21/2013 Discharge date: 01/23/2013  Time spent: 40 minutes  Recommendations for Outpatient Follow-up:  1. Followup with primary care physician within one week. 2. Recommend to refer to infectious disease specialist  Discharge Diagnoses:  Principal Problem:   Chest pain   Discharge Condition: Stable  Diet recommendation: Regular  Filed Weights   01/22/13 0140 01/22/13 0400 01/23/13 0500  Weight: 73.211 kg (161 lb 6.4 oz) 73.211 kg (161 lb 6.4 oz) 72.848 kg (160 lb 9.6 oz)    History of present illness:  Anne Obrien is a 72 y.o. female with history of breast cancer status post lumpectomy and radiation presently on tamoxifen presented to the ER because of persistent chest pain. The pain is retrosternal nonradiating pressure-like. Patient's symptoms started early in the morning yesterday which woke her up from sleep. She initially took some antacids with minimal relief. Subsequently she had some nitroglycerin which Anne Obrien had previously given which also did not relieve her much. Later she had nausea vomiting and diarrhea. Denies having taken any recent antibiotics. Patient did not have any abdominal pain. Since the pain persisted later went to her PCP in the evening and was referred to the ER. EKG done in the PCPs office was showing RBBB. Patient does have known history of RBBB. In the ER cardiac markers were negative. Patient's symptoms improved with morphine and presently is chest pain-free.   Hospital Course:   1. Chest pain: Patient given to the hospital with retrosternal nonradiating pressure-like chest pain. Nitroglycerin and antacids did not help, patient had nausea and vomiting. At the time of admission to the hospital patient was chest pain free, EKG showed right bundle branch block, 3 sets of troponin were negative. Chest  pain is likely secondary to nausea and vomiting. This is resolved completely.  2. Nausea/vomiting and diarrhea: This is likely viral gastroenteritis versus systemic effects of UTI. Patient did have leukocytosis which is resolved after hydration and initiation of antibiotics.  3. UTI: Urinalysis is consistent with UTI with, started on Rocephin, patient also did have leukocytosis. On discharge ciprofloxacin for 5 more days prescribed. Culture was still pending.  4. History of relapsing malaria: Patient did work as a Engineer, civil (consulting) in [place], she reported she hadn't history of relapsing malaria and she thought this is malaria episode. I discussed with her and recommended for her to followup with infectious diseases specialist. Last time she was in [place] was about 4 years ago.  Procedures:  None  Consultations:  None  Discharge Exam: Filed Vitals:   01/23/13 0800  BP: 120/64  Pulse: 74  Temp: 98.2 F (36.8 C)  Resp: 18  General: Alert and awake, oriented x3, not in any acute distress. HEENT: anicteric sclera, pupils reactive to light and accommodation, EOMI CVS: S1-S2 clear, no murmur rubs or gallops Chest: clear to auscultation bilaterally, no wheezing, rales or rhonchi Abdomen: soft nontender, nondistended, normal bowel sounds, no organomegaly Extremities: no cyanosis, clubbing or edema noted bilaterally Neuro: Cranial nerves II-XII intact, no focal neurological deficits  Discharge Instructions  Discharge Orders   Future Appointments Provider Department Dept Phone   02/15/2013 10:15 AM [facility] [place]  [facility]AGING [phone]   Patient should wear two piece clothing and wear no powder or deodorant. Patient should arrive 15 minutes early.   06/14/2013 10:30 AM Dava Najjar [facility] MEDICAL  ONCOLOGY [phone]   06/14/2013 11:00 AM Lowella Dell, [facility] MEDICAL ONCOLOGY [phone]   Future Orders Complete  By Expires   Increase activity slowly  As directed        Medication List         aspirin 81 MG tablet  Take 81 mg by mouth as needed.     b complex vitamins tablet  Take 1 tablet by mouth as needed.     calcium carbonate 600 MG Tabs tablet  Commonly known as:  OS-CAL  Take 600 mg by mouth 2 (two) times daily with a meal.     cholecalciferol 1000 UNITS tablet  Commonly known as:  VITAMIN D  Take 1,000 Units by mouth 2 (two) times daily.     ciprofloxacin 500 MG tablet  Commonly known as:  CIPRO  Take 1 tablet (500 mg total) by mouth 2 (two) times daily.     fish oil-omega-3 fatty acids 1000 MG capsule  Take 1 g by mouth daily.     glucosamine-chondroitin 500-400 MG tablet  Take 1 tablet by mouth daily. Triple strength     multivitamin tablet  Take 1 tablet by mouth daily.     tamoxifen 20 MG tablet  Commonly known as:  NOLVADEX  Take 1 tablet (20 mg total) by mouth daily.       No Known Allergies     Follow-up Information   Follow up with Anne Obrien In 1 week.   Specialty:  Family Medicine   Contact information:   [address] [place] [address] [phone]        The results of significant diagnostics from this hospitalization (including imaging, microbiology, ancillary and laboratory) are listed below for reference.    Significant Diagnostic Studies: Dg Chest 2 View  01/22/2013   CLINICAL DATA:  Intermittent chest pain.  Breast cancer.  EXAM: CHEST  2 VIEW  COMPARISON:  11/20/2009  FINDINGS: The heart size and mediastinal contours are within normal limits. Both lungs are clear. The visualized skeletal structures are unremarkable. Surgical clips overlie the left breast  IMPRESSION: No active cardiopulmonary disease.  Stable appearance from priors.   Electronically Signed   By: Davonna Belling M.D.   On: 01/22/2013 00:36    Microbiology: Recent Results (from the past 240 hour(s))  MALARIA SMEAR     Status: None   Collection  Time    01/22/13  3:54 AM      Result Value Range Status   Specimen Description Blood   Final   Special Requests Normal   Final   Malaria Prep     Final   Value: No Plasmodium or Other Blood Parasites Seen on Thick or Thin Smears For persons strongly suspected of having a blood parasite,but have negative smears, it is recommended that blood films be repeated approximately every 12 to 24 hours for 3 consecutive      days.     Performed at [facility]   Report Status 01/22/2013 FINAL   Final     Labs: Basic Metabolic Panel:  Recent Labs Lab 01/21/13 2155 01/22/13 0259 01/23/13 0545  NA 137  --  141  K 3.5  --  3.3*  CL 100  --  108  CO2 26  --  26  GLUCOSE 158*  --  91  BUN 15  --  12  CREATININE 0.63 0.66 0.75  CALCIUM 9.2  --  8.4  Liver Function Tests:  Recent Labs Lab 01/22/13 0259  AST 17  ALT 6  ALKPHOS 51  BILITOT 0.4  PROT 6.5  ALBUMIN 3.3*   No results found for this basename: LIPASE, AMYLASE,  in the last 168 hours No results found for this basename: AMMONIA,  in the last 168 hours CBC:  Recent Labs Lab 01/21/13 2155 01/22/13 0259 01/23/13 0545  WBC 15.1* 12.9* 7.8  HGB 14.8 13.2 12.4  HCT 41.2 37.8 36.4  MCV 85.8 86.9 88.3  PLT 205 189 162   Cardiac Enzymes:  Recent Labs Lab 01/22/13 0259 01/22/13 0738 01/22/13 1728  TROPONINI <0.30 <0.30 <0.30   BNP: BNP (last 3 results) No results found for this basename: PROBNP,  in the last 8760 hours CBG: No results found for this basename: GLUCAP,  in the last 168 hours     Signed:  Anella Nakata A  [facility] 01/23/2013, 10:28 AM

## 2013-01-26 LAB — MALARIA SMEAR

## 2013-02-15 ENCOUNTER — Ambulatory Visit
Admission: RE | Admit: 2013-02-15 | Discharge: 2013-02-15 | Disposition: A | Discharge status: Home / Self Care | Payer: Medicare Other | Source: Ambulatory Visit | Attending: Family | Admitting: Family

## 2013-02-15 DIAGNOSIS — Z853 Personal history of malignant neoplasm of breast: Secondary | ICD-10-CM

## 2013-06-14 ENCOUNTER — Other Ambulatory Visit (HOSPITAL_BASED_OUTPATIENT_CLINIC_OR_DEPARTMENT_OTHER): Payer: Medicare Other

## 2013-06-14 ENCOUNTER — Ambulatory Visit (HOSPITAL_BASED_OUTPATIENT_CLINIC_OR_DEPARTMENT_OTHER): Payer: Medicare Other | Admitting: Oncology

## 2013-06-14 ENCOUNTER — Telehealth: Payer: Self-pay | Admitting: Oncology

## 2013-06-14 VITALS — BP 116/77 | HR 80 | Temp 98.3°F | Resp 18 | Ht 63.0 in | Wt 160.4 lb

## 2013-06-14 DIAGNOSIS — C50419 Malignant neoplasm of upper-outer quadrant of unspecified female breast: Secondary | ICD-10-CM

## 2013-06-14 DIAGNOSIS — C50919 Malignant neoplasm of unspecified site of unspecified female breast: Secondary | ICD-10-CM

## 2013-06-14 DIAGNOSIS — Z170 Estrogen receptor positive status [ER+]: Secondary | ICD-10-CM

## 2013-06-14 DIAGNOSIS — Z853 Personal history of malignant neoplasm of breast: Secondary | ICD-10-CM

## 2013-06-14 LAB — CBC WITH DIFFERENTIAL/PLATELET
BASO%: 0.7 % (ref 0.0–2.0)
BASOS ABS: 0.1 10*3/uL (ref 0.0–0.1)
EOS%: 1.6 % (ref 0.0–7.0)
Eosinophils Absolute: 0.1 10*3/uL (ref 0.0–0.5)
HCT: 39.6 % (ref 34.8–46.6)
HEMOGLOBIN: 12.9 g/dL (ref 11.6–15.9)
LYMPH#: 1.5 10*3/uL (ref 0.9–3.3)
LYMPH%: 20.3 % (ref 14.0–49.7)
MCH: 29.1 pg (ref 25.1–34.0)
MCHC: 32.6 g/dL (ref 31.5–36.0)
MCV: 89.3 fL (ref 79.5–101.0)
MONO#: 0.5 10*3/uL (ref 0.1–0.9)
MONO%: 6.3 % (ref 0.0–14.0)
NEUT%: 71.1 % (ref 38.4–76.8)
NEUTROS ABS: 5.2 10*3/uL (ref 1.5–6.5)
Platelets: 185 10*3/uL (ref 145–400)
RBC: 4.43 10*6/uL (ref 3.70–5.45)
RDW: 13.1 % (ref 11.2–14.5)
WBC: 7.4 10*3/uL (ref 3.9–10.3)

## 2013-06-14 LAB — COMPREHENSIVE METABOLIC PANEL (CC13)
ALT: 9 U/L (ref 0–55)
AST: 16 U/L (ref 5–34)
Albumin: 3.8 g/dL (ref 3.5–5.0)
Alkaline Phosphatase: 51 U/L (ref 40–150)
Anion Gap: 7 mEq/L (ref 3–11)
BILIRUBIN TOTAL: 0.46 mg/dL (ref 0.20–1.20)
BUN: 16.1 mg/dL (ref 7.0–26.0)
CO2: 27 mEq/L (ref 22–29)
Calcium: 9.5 mg/dL (ref 8.4–10.4)
Chloride: 106 mEq/L (ref 98–109)
Creatinine: 0.8 mg/dL (ref 0.6–1.1)
Glucose: 115 mg/dl (ref 70–140)
Potassium: 4.1 mEq/L (ref 3.5–5.1)
SODIUM: 141 meq/L (ref 136–145)
Total Protein: 6.9 g/dL (ref 6.4–8.3)

## 2013-06-14 LAB — LACTATE DEHYDROGENASE (CC13): LDH: 166 U/L (ref 125–245)

## 2013-06-14 NOTE — Telephone Encounter (Signed)
, °

## 2013-06-14 NOTE — Progress Notes (Signed)
[place]  Telephone:(336) [phone] Fax:(336) [phone]  OFFICE PROGRESS NOTE   ID: Anne Obrien   DOB: 01-28-1941  MR#: [id number]  FHL#:[id number]   PCP: Gerrit Heck, MD GYN:  SU: Rolm Bookbinder, MD RAD ONCJodelle Gross, M.D.   HISTORY OF PRESENT ILLNESS: From Dr. [name]'s new patient evaluation note dated 03/21/2010: "This is a delightful 73 year old woman from [facility] referred by Dr. Donne Hazel for evaluation and treatment of breast cancer. 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  She really had not had any previous screening mammograms.  She had her first mammogram in October 2010.  A six-month followup is recommended because of some calcifications.  In April 2011, a second mammogram was performed, which showed stable calcifications and another six-month followup was recommended, which was performed in October 2011.  Stereotactic biopsies of calcifications were recommended.  A biopsy performed on February 02, 2010 showed fibrocystic changes in the right breast and in the left breast invasive ductal cancer with associated DCIS was seen.  This is ER/PR positive 100% respectively.  HER-2 was negative and proliferative index was 30%.  This patient subsequently had an MRI scan on 02/08/2010 biopsy changes were seen and no other abnormalities were seen.  The patient underwent lumpectomy, sentinel lymph node evaluation on 03/07/2010 final pathology showed 0.5 cm invasive ductal cancer.  A total of eight lymph nodes were removed with sentinel lymph node and these were all negative for malignancy.  Invasive disease was grade 2 with extensive DCIS.  No angiolymphatic invasion was seen.  Resection margins were clear.  The patient has had an unremarkable postoperative course."  Her subsequent history is as detailed below.    INTERVAL HISTORY: "Anne Obrien" returns today for followup of her breast cancer.  Interval history is generally unremarkable. Her main concern is that her husbands intellectual capacity appears to be declining fairly rapidly. Luckily currently she has 2 daughters staying at home with her (1 lost her job and the other one is trying to look for a better job) and they do help quite a bit.  REVIEW OF SYSTEMS: The patient is tolerating tamoxifen without any major complications. She has some low back and right knee pain which is long-standing. It is not more intense or persistent than before. A detailed review systems today was otherwise noncontributory   PAST MEDICAL HISTORY: Past Medical History  Diagnosis Date  . Bundle branch block   . Hepatitis A 1976  . Cancer     face  . Breast cancer     stage I left  . Arthritis   . Malaria   . 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  She has a history of left bundle-branch block seen by Dr. Mare Ferrari.     PAST SURGICAL HISTORY: Past Surgical History  Procedure Laterality Date  . Vaginal hysterectomy  may 2008    and vaginal repair  . Breast lumpectomy  02/2010    left lumpectomy and sentinel node  . Skin cancer destruction      Nose cryotherapy  Includes hysterectomy, ovaries were left in, this was at the age of 34.   FAMILY HISTORY Family History  Problem Relation Age of Onset  . Breast cancer Mother   . Arthritis Mother   . Breast cancer Maternal Grandmother   . Breast cancer Maternal  Aunt   . Pernicious anemia Father   . Arthritis Sister   . Arthritis Brother   Mother has a history of breast cancer.  Maternal grandmother has a history of breast cancer.  Maternal aunt had some form of malignancy.  Father died at age 40.  She has two brothers and two sisters.  No history of breast cancer.   GYNECOLOGIC HISTORY: She is G9, P6 with three miscarriages.  She had menarche at age 40.  No history of birth control pill use.    SOCIAL  HISTORY: The patient has been married  since 12.  They met while they were in college in [place].  Her husband is  [place] native.  They have 4 adult daughters, 2 adult sons, 3 grandchildren that live in [place], 3 grandchildren that live in [place], and a set of newborn twins that reside in [place], [place]. She is originally from [place], born and brought up in [place], [place]. 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  The patient also notes that her husband has lost his short-term memory.  In her spare time, Mrs. Kosiba enjoys going for walks and spending time with her family.   ADVANCED DIRECTIVES: Not on file  HEALTH MAINTENANCE: History  Substance Use Topics  . Smoking status: Never Smoker   . Smokeless tobacco: Never Used  . Alcohol Use: No    Colonoscopy:  07/2011 PAP: Not on file Bone density: The patient states she had a bone density scan in 01/2012, however we do not have a copy of the scan results Lipid panel: Not on file  No Known Allergies  Current Outpatient Prescriptions  Medication Sig Dispense Refill  . aspirin 81 MG tablet Take 81 mg by mouth as needed.       Marland Kitchen b complex vitamins tablet Take 1 tablet by mouth as needed.        . calcium carbonate (OS-CAL) 600 MG TABS Take 600 mg by mouth 2 (two) times daily with a meal.      . cholecalciferol (VITAMIN D) 1000 UNITS tablet Take 1,000 Units by mouth 2 (two) times daily.      . ciprofloxacin (CIPRO) 500 MG tablet Take 1 tablet (500 mg total) by mouth 2 (two) times daily.  10 tablet  0  . fish oil-omega-3 fatty acids 1000 MG capsule Take 1 g by mouth daily.        Marland Kitchen glucosamine-chondroitin 500-400 MG tablet Take 1 tablet by mouth daily. Triple strength       . Multiple Vitamin (MULTIVITAMIN) tablet Take 1 tablet by mouth daily.        . tamoxifen (NOLVADEX) 20 MG tablet Take 1  tablet (20 mg total) by mouth daily.  90 tablet  8   No current facility-administered medications for this visit.    OBJECTIVE: Middle-aged white woman in no acute distress Filed Vitals:   06/14/13 1039  BP: 116/77  Pulse: 80  Temp: 98.3 F (36.8 C)  Resp: 18     Body mass index is 28.42 kg/(m^2).      ECOG FS: 1 - Symptomatic but completely ambulatory  Sclerae unicteric, pupils round and equal Oropharynx clear and moist No cervical or supraclavicular adenopathy Lungs no rales or rhonchi Heart regular rate and rhythm Abd soft, nontender, positive bowel sounds MSK no focal spinal tenderness, no upper extremity lymphedema Neuro: nonfocal,  well oriented, depressed affect Breasts: The right breast is unremarkable to the left breast is status post lumpectomy and radiation. There is no evidence of local recurrence. Left axilla is benign.     LAB RESULTS: Lab Results  Component Value Date   WBC 7.4 06/14/2013   NEUTROABS 5.2 06/14/2013   HGB 12.9 06/14/2013   HCT 39.6 06/14/2013   MCV 89.3 06/14/2013   PLT 185 06/14/2013      Chemistry      Component Value Date/Time   NA 141 06/14/2013 1026   NA 141 01/23/2013 0545   K 4.1 06/14/2013 1026   K 3.3* 01/23/2013 0545   CL 108 01/23/2013 0545   CL 107 09/15/2012 1236   CO2 27 06/14/2013 1026   CO2 26 01/23/2013 0545   BUN 16.1 06/14/2013 1026   BUN 12 01/23/2013 0545   CREATININE 0.8 06/14/2013 1026   CREATININE 0.75 01/23/2013 0545      Component Value Date/Time   CALCIUM 9.5 06/14/2013 1026   CALCIUM 8.4 01/23/2013 0545   ALKPHOS 51 06/14/2013 1026   ALKPHOS 51 01/22/2013 0259   AST 16 06/14/2013 1026   AST 17 01/22/2013 0259   ALT 9 06/14/2013 1026   ALT 6 01/22/2013 0259   BILITOT 0.46 06/14/2013 1026   BILITOT 0.4 01/22/2013 0259       Lab Results  Component Value Date   LABCA2 17 09/05/2011    Urinalysis    Component Value Date/Time   COLORURINE YELLOW 01/22/2013 0849    STUDIES:  DIGITAL DIAGNOSTIC bilateral MAMMOGRAM  WITH CAD  COMPARISON: Previous examinations.  ACR Breast Density Category b: There are scattered areas of  fibroglandular density.  FINDINGS:  There are stable mild scarring changes within the left breast  related to the patient's lumpectomy. There is no specific evidence  for recurrent tumor or developing malignancy within either breast.  Mammographic images were processed with CAD.  IMPRESSION:  No findings worrisome for recurrent tumor or developing malignancy.  RECOMMENDATION:  Annual diagnostic mammography.  I have discussed the findings and recommendations with the patient.  Results were also provided in writing at the conclusion of the  visit. If applicable, a reminder letter will be sent to the patient  regarding the next appointment.  BI-RADS CATEGORY 1: Negative  Electronically Signed  By: Luberta Robertson M.D.  On: 02/15/2013 10:29    ASSESSMENT: 73 y.o. BRCA negative [place], [place] woman:  1.   Status post left breast lumpectomy with left axillary sentinel node biopsy on 03/07/2010 for a  pT1a pN0 (i-) (sn-), stage I  invasive ductal carcinoma, grade 2, estrogen receptor 100% positive, progesterone receptor 100% positive, Ki-67 30%, HER-2/neu by CISH no amplification   2.  status post adjuvant radiation therapy from 04/11/2010 to 05/09/2010.  3.  started antiestrogen therapy with Tamoxifen in 05/2010.  PLAN: Anne Obrien is doing fine as far as the tamoxifen is concerned and there is no evidence of disease recurrence. The plan is to continue tamoxifen for total of 5 years, which will take [place] to February of 2017.  I was sorry to learn of her husband's mental decline. I recommended she obtained "this 36 hour day", which I think will be of help to her both practically and emotionally.  She knows to call for any problems that may develop before next visit here, which will be in one year 06/14/2013, 11:18 AM  Chauncey Cruel, MD

## 2013-06-15 LAB — VITAMIN D 25 HYDROXY (VIT D DEFICIENCY, FRACTURES): Vit D, 25-Hydroxy: 92 ng/mL — ABNORMAL HIGH (ref 30–89)

## 2013-10-21 ENCOUNTER — Other Ambulatory Visit: Payer: Self-pay | Admitting: *Deleted

## 2013-10-21 DIAGNOSIS — Z853 Personal history of malignant neoplasm of breast: Secondary | ICD-10-CM

## 2013-10-21 MED ORDER — TAMOXIFEN CITRATE 20 MG PO TABS: 20.0000 mg | ORAL_TABLET | Freq: Every day | ORAL | Status: DC

## 2013-10-21 NOTE — Telephone Encounter (Signed)
Patient called to have Rx for Tamoxifen transferred to new pharmacy.

## 2014-01-21 ENCOUNTER — Other Ambulatory Visit: Payer: Self-pay | Admitting: Oncology

## 2014-01-21 DIAGNOSIS — Z853 Personal history of malignant neoplasm of breast: Secondary | ICD-10-CM

## 2014-01-28 ENCOUNTER — Other Ambulatory Visit: Payer: Self-pay | Admitting: *Deleted

## 2014-01-28 DIAGNOSIS — Z853 Personal history of malignant neoplasm of breast: Secondary | ICD-10-CM

## 2014-01-28 MED ORDER — TAMOXIFEN CITRATE 20 MG PO TABS: 20.0000 mg | ORAL_TABLET | Freq: Every day | ORAL | Status: DC

## 2014-02-17 ENCOUNTER — Ambulatory Visit
Admission: RE | Admit: 2014-02-17 | Discharge: 2014-02-17 | Disposition: A | Discharge status: Home / Self Care | Payer: Medicare Other | Source: Ambulatory Visit | Attending: Oncology | Admitting: Oncology

## 2014-02-17 DIAGNOSIS — Z853 Personal history of malignant neoplasm of breast: Secondary | ICD-10-CM

## 2014-06-20 ENCOUNTER — Other Ambulatory Visit: Payer: Self-pay | Admitting: Emergency Medicine

## 2014-06-20 DIAGNOSIS — C50919 Malignant neoplasm of unspecified site of unspecified female breast: Secondary | ICD-10-CM

## 2014-06-21 ENCOUNTER — Telehealth: Payer: Self-pay | Admitting: Oncology

## 2014-06-21 ENCOUNTER — Ambulatory Visit (HOSPITAL_BASED_OUTPATIENT_CLINIC_OR_DEPARTMENT_OTHER): Payer: Medicare Other | Admitting: Oncology

## 2014-06-21 ENCOUNTER — Other Ambulatory Visit (HOSPITAL_BASED_OUTPATIENT_CLINIC_OR_DEPARTMENT_OTHER): Payer: Medicare Other

## 2014-06-21 VITALS — BP 142/79 | HR 86 | Temp 98.3°F | Resp 18 | Ht 63.0 in | Wt 160.8 lb

## 2014-06-21 DIAGNOSIS — M8580 Other specified disorders of bone density and structure, unspecified site: Secondary | ICD-10-CM

## 2014-06-21 DIAGNOSIS — C50412 Malignant neoplasm of upper-outer quadrant of left female breast: Secondary | ICD-10-CM

## 2014-06-21 DIAGNOSIS — C50919 Malignant neoplasm of unspecified site of unspecified female breast: Secondary | ICD-10-CM

## 2014-06-21 DIAGNOSIS — Z170 Estrogen receptor positive status [ER+]: Secondary | ICD-10-CM

## 2014-06-21 LAB — CBC WITH DIFFERENTIAL/PLATELET
BASO%: 0.5 % (ref 0.0–2.0)
Basophils Absolute: 0 10*3/uL (ref 0.0–0.1)
EOS%: 1.1 % (ref 0.0–7.0)
Eosinophils Absolute: 0.1 10*3/uL (ref 0.0–0.5)
HCT: 39.7 % (ref 34.8–46.6)
HGB: 13.2 g/dL (ref 11.6–15.9)
LYMPH%: 17.5 % (ref 14.0–49.7)
MCH: 29.7 pg (ref 25.1–34.0)
MCHC: 33.2 g/dL (ref 31.5–36.0)
MCV: 89.2 fL (ref 79.5–101.0)
MONO#: 0.6 10*3/uL (ref 0.1–0.9)
MONO%: 7.6 % (ref 0.0–14.0)
NEUT#: 5.8 10*3/uL (ref 1.5–6.5)
NEUT%: 73.3 % (ref 38.4–76.8)
Platelets: 194 10*3/uL (ref 145–400)
RBC: 4.45 10*6/uL (ref 3.70–5.45)
RDW: 13 % (ref 11.2–14.5)
WBC: 7.9 10*3/uL (ref 3.9–10.3)
lymph#: 1.4 10*3/uL (ref 0.9–3.3)

## 2014-06-21 LAB — COMPREHENSIVE METABOLIC PANEL (CC13)
ALT: 13 U/L (ref 0–55)
AST: 20 U/L (ref 5–34)
Albumin: 3.6 g/dL (ref 3.5–5.0)
Alkaline Phosphatase: 57 U/L (ref 40–150)
Anion Gap: 9 mEq/L (ref 3–11)
BUN: 13.9 mg/dL (ref 7.0–26.0)
CO2: 25 meq/L (ref 22–29)
Calcium: 9.1 mg/dL (ref 8.4–10.4)
Chloride: 106 mEq/L (ref 98–109)
Creatinine: 0.8 mg/dL (ref 0.6–1.1)
EGFR: 77 mL/min/{1.73_m2} — ABNORMAL LOW (ref >90)
GLUCOSE: 121 mg/dL (ref 70–140)
POTASSIUM: 4.5 meq/L (ref 3.5–5.1)
Sodium: 141 mEq/L (ref 136–145)
Total Bilirubin: 0.65 mg/dL (ref 0.20–1.20)
Total Protein: 6.8 g/dL (ref 6.4–8.3)

## 2014-06-21 NOTE — Telephone Encounter (Signed)
appts made and avs printed for pt  Anne Obrien °

## 2014-06-21 NOTE — Progress Notes (Signed)
[place]  Telephone:(336) [phone] Fax:(336) [phone]  OFFICE PROGRESS NOTE   ID: Anne Obrien   DOB: 06/24/1940  MR#: [id number]  IOM#:[id number]   PCP: Gerrit Heck, MD GYN:  SU: Rolm Bookbinder, MD RAD ONCJodelle Gross, M.D.   HISTORY OF PRESENT ILLNESS: From Dr. [name]'s new patient evaluation note dated 03/21/2010:  "This is a delightful 74 year old woman from [facility] referred by Dr. Donne Hazel for evaluation and treatment of breast cancer. 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  She really had not had any previous screening mammograms.  She had her first mammogram in October 2010.  A six-month followup is recommended because of some calcifications.  In April 2011, a second mammogram was performed, which showed stable calcifications and another six-month followup was recommended, which was performed in October 2011.  Stereotactic biopsies of calcifications were recommended.  A biopsy performed on February 02, 2010 showed fibrocystic changes in the right breast and in the left breast invasive ductal cancer with associated DCIS was seen.  This is ER/PR positive 100% respectively.  HER-2 was negative and proliferative index was 30%.  This patient subsequently had an MRI scan on 02/08/2010 biopsy changes were seen and no other abnormalities were seen.  The patient underwent lumpectomy, sentinel lymph node evaluation on 03/07/2010 final pathology showed 0.5 cm invasive ductal cancer.  A total of eight lymph nodes were removed with sentinel lymph node and these were all negative for malignancy.  Invasive disease was grade 2 with extensive DCIS.  No angiolymphatic invasion was seen.  Resection margins were clear.  The patient has had an unremarkable postoperative course."    Her subsequent history is as detailed below.    INTERVAL HISTORY: "Anne Obrien" returns today for followup of her breast  cancer. She continues on tamoxifen with excellent tolerance. She has no side effects referable to that drug that she is aware of.  REVIEW OF SYSTEMS: She pulled a muscle in her right groin. She is getting some physical therapy for that. That does keep her from walking long distances, she says. Her husband spends Tuesdays and many Thursdays at their daughter's home with the 2 grandchildren which gives her a little bit of time off. She is planning to start her garden now that the weather is better. She had a tooth pulled about 2 years ago she says. A detailed review of systems today is otherwise noncontributory   PAST MEDICAL HISTORY: Past Medical History  Diagnosis Date  . Bundle branch block   . Hepatitis A 1976  . Cancer     face  . Breast cancer     stage I left  . Arthritis   . Malaria   . 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  She has a history of left bundle-branch block seen by Dr. Mare Ferrari.     PAST SURGICAL HISTORY: Past Surgical History  Procedure Laterality Date  . Vaginal hysterectomy  may 2008    and vaginal repair  . Breast lumpectomy  02/2010    left lumpectomy and sentinel node  . Skin cancer destruction      Nose cryotherapy  Includes hysterectomy, ovaries were left in, this was at the age of 25.   FAMILY HISTORY Family History  Problem Relation Age of Onset  . Breast cancer Mother   .  Arthritis Mother   . Breast cancer Maternal Grandmother   . Breast cancer Maternal Aunt   . Pernicious anemia Father   . Arthritis Sister   . Arthritis Brother   Mother has a history of breast cancer.  Maternal grandmother has a history of breast cancer.  Maternal aunt had some form of malignancy.  Father died at age 28.  She has two brothers and two sisters.  No history of breast cancer.   GYNECOLOGIC HISTORY: She is G9, P6 with three miscarriages.  She had menarche at age 55.  No history of  birth control pill use.    SOCIAL HISTORY: The patient has been married  since 49.  They met while they were in college in [place].  Her husband is  [place] native.  They have 4 adult daughters, 2 adult sons, 3 grandchildren that live in [place], 3 grandchildren that live in [place], and a set of newborn twins that reside in [place], [place]. She is originally from [place], born and brought up in [place], [place]. 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  The patient also notes that her husband has lost his short-term memory.  In her spare time, Mrs. Urquilla enjoys going for walks and spending time with her family.   ADVANCED DIRECTIVES: Not on file  HEALTH MAINTENANCE: History  Substance Use Topics  . Smoking status: Never Smoker   . Smokeless tobacco: Never Used  . Alcohol Use: No    Colonoscopy:  07/2011 PAP: Not on file Bone density: Repeat DEXA scan shows further bone density loss, per patient report Lipid panel: Not on file  No Known Allergies  Current Outpatient Prescriptions  Medication Sig Dispense Refill  . aspirin 81 MG tablet Take 81 mg by mouth as needed.     Marland Kitchen b complex vitamins tablet Take 1 tablet by mouth as needed.      . calcium carbonate (OS-CAL) 600 MG TABS Take 600 mg by mouth 2 (two) times daily with a meal.    . cholecalciferol (VITAMIN D) 1000 UNITS tablet Take 1,000 Units by mouth 2 (two) times daily.    . ciprofloxacin (CIPRO) 500 MG tablet Take 1 tablet (500 mg total) by mouth 2 (two) times daily. 10 tablet 0  . fish oil-omega-3 fatty acids 1000 MG capsule Take 1 g by mouth daily.      Marland Kitchen glucosamine-chondroitin 500-400 MG tablet Take 1 tablet by mouth daily. Triple strength     . Multiple Vitamin (MULTIVITAMIN) tablet Take 1 tablet by mouth daily.      . tamoxifen (NOLVADEX) 20 MG tablet Take 1 tablet (20 mg total) by  mouth daily. 90 tablet 3   No current facility-administered medications for this visit.    OBJECTIVE: Middle-aged white woman who appears stated age 53 Vitals:   06/21/14 1026  BP: 142/79  Pulse: 86  Temp: 98.3 F (36.8 C)  Resp: 18     Body mass index is 28.49 kg/(m^2).      ECOG FS: 1 - Symptomatic but completely ambulatory  Sclerae unicteric, EOMs intact Oropharynx clear, dentition in good repair No cervical or supraclavicular adenopathy Lungs no rales or rhonchi Heart regular rate and rhythm Abd soft, nontender, positive bowel sounds MSK no focal spinal tenderness, no upper extremity lymphedema Neuro: nonfocal, well oriented, appropriate affect Breasts: I do not see any skin or nipple changes  of concern in the right breast. The left breast is status post lumpectomy and radiation. There is no evidence of local recurrence. The left axilla is benign.     LAB RESULTS: Lab Results  Component Value Date   WBC 7.9 06/21/2014   NEUTROABS 5.8 06/21/2014   HGB 13.2 06/21/2014   HCT 39.7 06/21/2014   MCV 89.2 06/21/2014   PLT 194 06/21/2014      Chemistry      Component Value Date/Time   NA 141 06/14/2013 1026   NA 141 01/23/2013 0545   K 4.1 06/14/2013 1026   K 3.3* 01/23/2013 0545   CL 108 01/23/2013 0545   CL 107 09/15/2012 1236   CO2 27 06/14/2013 1026   CO2 26 01/23/2013 0545   BUN 16.1 06/14/2013 1026   BUN 12 01/23/2013 0545   CREATININE 0.8 06/14/2013 1026   CREATININE 0.75 01/23/2013 0545      Component Value Date/Time   CALCIUM 9.5 06/14/2013 1026   CALCIUM 8.4 01/23/2013 0545   ALKPHOS 51 06/14/2013 1026   ALKPHOS 51 01/22/2013 0259   AST 16 06/14/2013 1026   AST 17 01/22/2013 0259   ALT 9 06/14/2013 1026   ALT 6 01/22/2013 0259   BILITOT 0.46 06/14/2013 1026   BILITOT 0.4 01/22/2013 0259       Lab Results  Component Value Date   LABCA2 17 09/05/2011    Urinalysis    Component Value Date/Time   COLORURINE YELLOW 01/22/2013 0849     STUDIES: CLINICAL DATA: History of left lumpectomy for breast cancer 2012.  EXAM: DIGITAL DIAGNOSTIC bilateral MAMMOGRAM WITH CAD  COMPARISON: [facility]  ACR Breast Density Category b: There are scattered areas of fibroglandular density.  FINDINGS: Left lumpectomy changes are noted. There is a group of round calcifications in the region of the lumpectomy site, visualize previously in the left upper outer quadrant, previously posterior to the biopsy proven breast cancer, stable since the least remote available comparison exam 2010. Evidence of benign right breast biopsy reidentified. No new suspicious finding is seen in either breast.  Mammographic images were processed with CAD.  IMPRESSION: Stable left lumpectomy change, no evidence for malignancy in either breast.  RECOMMENDATION: Diagnostic mammogram is suggested in 1 year. (Code:DM-B-01Y)  I have discussed the findings and recommendations with the patient. Results were also provided in writing at the conclusion of the visit. If applicable, a reminder letter will be sent to the patient regarding the next appointment.  BI-RADS CATEGORY 2: Benign.   Electronically Signed  By: Conchita Paris M.D.  On: 02/17/2014 10:19        ASSESSMENT: 74 y.o. BRCA negative [place], [place] woman:  1.   Status post left breast lumpectomy with left axillary sentinel node biopsy on 03/07/2010 for a  pT1a pN0 (i-) (sn-), stage I  invasive ductal carcinoma, grade 2, estrogen receptor 100% positive, progesterone receptor 100% positive, Ki-67 30%, HER-2/neu by CISH no amplification   2.  status post adjuvant radiation therapy from 04/11/2010 to 05/09/2010.  3.  started antiestrogen therapy with Tamoxifen in 05/2010.  PLAN: Anne Obrien will continue tamoxifen for another year and then "graduate" from follow-up.  She is concerned about her osteopenia. She is thinking about Fosamax but dreading having to take  it repeatedly and worrying about reflux issues. We discussed Reclast) zolendronate close (and she is very interested in that, but concerned about cost. I am checking with our billing people to see if there would be any out-of-pocket cost  for her at all. If so she would be interested in receiving it.  We did discuss the possible side effects, toxicities and complications including transient hypocalcemia, transient bone pain, and the very rare case of osteonecrosis of the jaw.  Anne Obrien will call with any problems that may develop before her next visit here. 06/21/2014, 10:47 AM  Chauncey Cruel, MD

## 2014-06-23 ENCOUNTER — Other Ambulatory Visit: Payer: Self-pay | Admitting: Oncology

## 2014-06-27 ENCOUNTER — Telehealth: Payer: Self-pay | Admitting: *Deleted

## 2014-06-27 NOTE — Telephone Encounter (Signed)
Per Dr. Jana Hakim, I spoke with patient and informed her that her insurance company has approved Zometa but I don't know what her out of pocket cost is going to be. Instructed patient that she needs to call her insurance company because we don't know her plan and what she has already paid this year. Patient verbalized understanding.

## 2014-06-29 ENCOUNTER — Other Ambulatory Visit: Payer: Self-pay | Admitting: Oncology

## 2014-06-29 ENCOUNTER — Telehealth: Payer: Self-pay

## 2014-06-29 NOTE — Telephone Encounter (Signed)
Routed addendum to telephone call dated 2/21

## 2014-06-29 NOTE — Telephone Encounter (Signed)
Pt researched with her insurance and the cost to her would be $95. She will put it off for now. I encouraged calcium, vitamin D and weight bearing exercise. Pt stated she is already doing that. Forwarded toDr Teacher, music.

## 2014-06-29 NOTE — Telephone Encounter (Signed)
Thank you for clarifying this

## 2014-06-29 NOTE — Progress Notes (Unsigned)
Status: Signed       Expand All Collapse All   Pt researched with her insurance and the cost to her would be $95. She will put it off for now. I encouraged calcium, vitamin D and weight bearing exercise. Pt stated she is already doing that. Forwarded toDr Teacher, music.            Hebert Soho, RN at 06/27/2014 3:19 PM     Status: Signed       Expand All Collapse All   Per Dr. Jana Hakim, I spoke with patient and informed her that her insurance company has approved Zometa but I don't know what her out of pocket cost is going to be. Instructed patient that she needs to call her insurance company because we don't know her plan and what she has already paid this year. Patient verbalized understanding.

## 2014-07-20 ENCOUNTER — Ambulatory Visit
Admission: RE | Admit: 2014-07-20 | Discharge: 2014-07-20 | Disposition: A | Discharge status: Home / Self Care | Payer: Medicare Other | Source: Ambulatory Visit | Attending: Family Medicine | Admitting: Family Medicine

## 2014-07-20 ENCOUNTER — Other Ambulatory Visit: Payer: Self-pay | Admitting: Family Medicine

## 2014-07-20 DIAGNOSIS — M545 Low back pain: Secondary | ICD-10-CM

## 2014-07-20 DIAGNOSIS — R1031 Right lower quadrant pain: Secondary | ICD-10-CM

## 2014-08-19 ENCOUNTER — Other Ambulatory Visit: Payer: Self-pay | Admitting: *Deleted

## 2014-08-19 DIAGNOSIS — Z853 Personal history of malignant neoplasm of breast: Secondary | ICD-10-CM

## 2014-08-19 MED ORDER — TAMOXIFEN CITRATE 20 MG PO TABS: 20.0000 mg | ORAL_TABLET | Freq: Every day | ORAL | Status: DC

## 2014-08-19 NOTE — Telephone Encounter (Signed)
Patient called requesting refill on Tamoxifen be sent to [facility] on [address]

## 2014-09-06 ENCOUNTER — Telehealth: Payer: Self-pay | Admitting: *Deleted

## 2014-09-06 NOTE — Telephone Encounter (Signed)
TC from pt requesting her records be fax'd or sent to Dr. Kristeen Miss including office visits, labs and recent xrays of her spine. She has an appt. With him on 09/08/14

## 2014-09-08 ENCOUNTER — Telehealth: Payer: Self-pay | Admitting: Oncology

## 2014-09-08 NOTE — Telephone Encounter (Signed)
Medical records routed to Dr. Kristeen Miss

## 2014-12-19 ENCOUNTER — Ambulatory Visit: Payer: Self-pay

## 2014-12-19 DIAGNOSIS — Z23 Encounter for immunization: Secondary | ICD-10-CM

## 2015-01-16 ENCOUNTER — Other Ambulatory Visit: Payer: Self-pay | Admitting: Oncology

## 2015-01-16 DIAGNOSIS — Z853 Personal history of malignant neoplasm of breast: Secondary | ICD-10-CM

## 2015-02-21 ENCOUNTER — Other Ambulatory Visit: Payer: Self-pay | Admitting: Oncology

## 2015-02-21 ENCOUNTER — Ambulatory Visit
Admission: RE | Admit: 2015-02-21 | Discharge: 2015-02-21 | Disposition: A | Discharge status: Home / Self Care | Payer: Medicare Other | Source: Ambulatory Visit | Attending: Oncology | Admitting: Oncology

## 2015-02-21 ENCOUNTER — Encounter: Payer: Self-pay | Admitting: Oncology

## 2015-02-21 DIAGNOSIS — Z853 Personal history of malignant neoplasm of breast: Secondary | ICD-10-CM

## 2015-03-08 ENCOUNTER — Telehealth: Payer: Self-pay | Admitting: Oncology

## 2015-03-08 NOTE — Telephone Encounter (Signed)
RETURNED PATIENT CALL RE R/S 3/23 APPOINTMENTS. GAVE PATEINT NEW APPOINTMENT FOR 3/2 - DATE PER PATIENT REQUEST.

## 2015-04-14 ENCOUNTER — Telehealth: Payer: Self-pay | Admitting: Oncology

## 2015-04-14 NOTE — Telephone Encounter (Signed)
Called and left a message with a new appointment due to dr Jana Hakim out of office

## 2015-04-18 ENCOUNTER — Encounter: Payer: Self-pay | Admitting: Cardiology

## 2015-04-18 ENCOUNTER — Ambulatory Visit (INDEPENDENT_AMBULATORY_CARE_PROVIDER_SITE_OTHER): Payer: Medicare Other | Admitting: Cardiology

## 2015-04-18 VITALS — BP 130/80 | HR 66 | Ht 62.0 in | Wt 153.6 lb

## 2015-04-18 DIAGNOSIS — M169 Osteoarthritis of hip, unspecified: Secondary | ICD-10-CM | POA: Insufficient documentation

## 2015-04-18 DIAGNOSIS — I4510 Unspecified right bundle-branch block: Secondary | ICD-10-CM | POA: Insufficient documentation

## 2015-04-18 DIAGNOSIS — R9431 Abnormal electrocardiogram [ECG] [EKG]: Secondary | ICD-10-CM | POA: Diagnosis not present

## 2015-04-18 DIAGNOSIS — M1611 Unilateral primary osteoarthritis, right hip: Secondary | ICD-10-CM

## 2015-04-18 DIAGNOSIS — M1612 Unilateral primary osteoarthritis, left hip: Secondary | ICD-10-CM | POA: Diagnosis not present

## 2015-04-18 DIAGNOSIS — Z01810 Encounter for preprocedural cardiovascular examination: Secondary | ICD-10-CM

## 2015-04-18 DIAGNOSIS — I450 Right fascicular block: Secondary | ICD-10-CM

## 2015-04-18 NOTE — Patient Instructions (Addendum)
Medication Instructions:  Your physician recommends that you continue on your current medications as directed. Please refer to the Current Medication list given to you today.   Labwork: None  Testing/Procedures: Your physician has requested that you have a lexiscan myoview. For further information please visit [email or web address]. Please follow instruction sheet, as given.  Follow-Up: Your physician recommends that you schedule a follow-up appointment AS NEEDED with Dr. Radford Pax pending your study results.  Any Other Special Instructions Will Be Listed Below (If Applicable).     If you need a refill on your cardiac medications before your next appointment, please call your pharmacy.

## 2015-04-18 NOTE — Progress Notes (Signed)
Cardiology Office Note   Date:  04/18/2015   ID:  Anne Obrien, DOB 1941/03/26, MRN [id number]  PCP:  Gerrit Heck, MD    Chief Complaint  Patient presents with  . Abnormal ECG      History of Present Illness: Anne Obrien is a 75 y.o. female who presents for preoperative cardiac clearance for Right THR.  She has a history of breast CA and negative stress test 2011 for abnormal EKG.  She had bariatric surgery in August of 2016.  She is doing well from a cardiac standpoint.  She is somewhat limited in her walking but can walk for 15-20 minutes around a park but at a very slow pace.  She denies any chest pain, SOB, DOE, LE edema, dizziness, palpiaations or syncope.      Past Medical History  Diagnosis Date  . Bundle branch block   . Hepatitis A 1976  . Cancer [facility])     face  . Breast cancer ([place])     stage I left  . Arthritis   . Malaria   . Osteopenia   . Malaria     Past Surgical History  Procedure Laterality Date  . Vaginal hysterectomy  may 2008    and vaginal repair  . Breast lumpectomy  02/2010    left lumpectomy and sentinel node  . Skin cancer destruction      Nose cryotherapy  . Bariatric surgery N/A      Current Outpatient Prescriptions  Medication Sig Dispense Refill  . aspirin 81 MG tablet Take 81 mg by mouth daily. At night    . calcium carbonate (OS-CAL) 600 MG TABS Take 600 mg by mouth 2 (two) times daily with a meal.    . cholecalciferol (VITAMIN D) 1000 UNITS tablet Take 1,000 Units by mouth 2 (two) times daily.    . fish oil-omega-3 fatty acids 1000 MG capsule Take 1 g by mouth daily. Patient takes 2-3 a day    . glucosamine-chondroitin 500-400 MG tablet Take 1 tablet by mouth daily. Triple strength     . Multiple Vitamin (MULTIVITAMIN) tablet Take 1 tablet by mouth daily.      . mupirocin cream (BACTROBAN) 2 % Apply 1 application topically 3 (three) times daily.    . naproxen sodium (ALEVE) 220 MG  tablet Take 220 mg by mouth 2 (two) times daily as needed (pain).     . tamoxifen (NOLVADEX) 20 MG tablet Take 1 tablet (20 mg total) by mouth daily. 90 tablet 4   No current facility-administered medications for this visit.    Allergies:   Review of patient's allergies indicates no known allergies.    Social History:  The patient  reports that she has never smoked. She has never used smokeless tobacco. She reports that she does not drink alcohol or use illicit drugs.   Family History:  The patient's family history includes Arthritis in her brother, mother, and sister; Breast cancer in her maternal aunt, maternal grandmother, and mother; Pernicious anemia in her father.    ROS:  Please see the history of present illness.   Otherwise, review of systems are positive for none.   All other systems are reviewed and negative.    PHYSICAL EXAM: VS:  BP 130/80 mmHg  Pulse 66  Ht 5\' 2"  (1.575 m)  Wt 153 lb 9.6 oz (69.673 kg)  BMI 28.09 kg/m2 ,  BMI Body mass index is 28.09 kg/(m^2). GEN: Well nourished, well developed, in no acute distress HEENT: normal Neck: no JVD, carotid bruits, or masses Cardiac: RRR; no murmurs, rubs, or gallops,no edema  Respiratory:  clear to auscultation bilaterally, normal work of breathing GI: soft, nontender, nondistended, + BS MS: no deformity or atrophy Skin: warm and dry, no rash Neuro:  Strength and sensation are intact Psych: euthymic mood, full affect   EKG:  EKG is ordered today. The ekg ordered today demonstrates NSR with no ST changes with IRBBB unchanged from 2014  Recent Labs: 06/21/2014: ALT 13; BUN 13.9; Creatinine 0.8; HGB 13.2; Platelets 194; Potassium 4.5; Sodium 141    Lipid Panel No results found for: CHOL, TRIG, HDL, CHOLHDL, VLDL, LDLCALC, LDLDIRECT    Wt Readings from Last 3 Encounters:  04/18/15 153 lb 9.6 oz (69.673 kg)  06/21/14 160 lb 12.8 oz (72.938 kg)  06/14/13 160 lb 6.4 oz (72.757 kg)        ASSESSMENT AND  PLAN:  1.  Abnormal EKG with IRBBB that is unchanged from 2013.  She has no chest pain or DOE but is very limited in her activity due to hip pain.  Therefore, I have recommended that we proceed with lexiscan myoview prior to clearing for surgery.   2.  DJD of hip needing preoperative cardiac clearance for THR 3.  Incomplete RBBB   Current medicines are reviewed at length with the patient today.  The patient does not have concerns regarding medicines.  The following changes have been made:  no change  Labs/ tests ordered today: See above Assessment and Plan No orders of the defined types were placed in this encounter.     Disposition:   FU with me PRN pending results of nuclear stress test  SignedSueanne Margarita, MD  04/18/2015 2:31 PM    [facility] [place], [place], [place]  [address] Phone: [phone]; Fax: [phone]

## 2015-04-18 NOTE — Addendum Note (Signed)
Addended by: Fransico Him R on: 04/18/2015 09:59 PM   Modules accepted: Level of Service

## 2015-04-19 NOTE — Addendum Note (Signed)
Addended by: Freada Bergeron on: 04/19/2015 04:46 PM   Modules accepted: Orders

## 2015-04-25 ENCOUNTER — Telehealth (HOSPITAL_COMMUNITY): Payer: Self-pay | Admitting: *Deleted

## 2015-04-25 NOTE — Telephone Encounter (Signed)
Patient given detailed instructions per Myocardial Perfusion Study Information Sheet for the test on 04/27/15 at 1000. Patient notified to arrive 15 minutes early and that it is imperative to arrive on time for appointment to keep from having the test rescheduled.  If you need to cancel or reschedule your appointment, please call the office within 24 hours of your appointment. Failure to do so may result in a cancellation of your appointment, and a $50 no show fee. Patient verbalized understanding.Hasel Janish, Ranae Palms

## 2015-04-27 ENCOUNTER — Ambulatory Visit (HOSPITAL_COMMUNITY): Payer: Medicare Other | Attending: Cardiovascular Disease

## 2015-04-27 ENCOUNTER — Telehealth: Payer: Self-pay | Admitting: *Deleted

## 2015-04-27 DIAGNOSIS — I450 Right fascicular block: Secondary | ICD-10-CM | POA: Insufficient documentation

## 2015-04-27 DIAGNOSIS — Z01810 Encounter for preprocedural cardiovascular examination: Secondary | ICD-10-CM | POA: Diagnosis present

## 2015-04-27 DIAGNOSIS — R9431 Abnormal electrocardiogram [ECG] [EKG]: Secondary | ICD-10-CM | POA: Insufficient documentation

## 2015-04-27 DIAGNOSIS — I4510 Unspecified right bundle-branch block: Secondary | ICD-10-CM

## 2015-04-27 LAB — MYOCARDIAL PERFUSION IMAGING
LV dias vol: 83 mL
LV sys vol: 34 mL
Peak HR: 105 {beats}/min
RATE: 0.25
Rest HR: 64 {beats}/min
SDS: 2
SRS: 2
SSS: 4
TID: 1.16

## 2015-04-27 MED ORDER — TECHNETIUM TC 99M SESTAMIBI GENERIC - CARDIOLITE
30.6000 | Freq: Once | INTRAVENOUS | Status: AC | PRN
  Administered 2015-04-27: 31 via INTRAVENOUS

## 2015-04-27 MED ORDER — TECHNETIUM TC 99M SESTAMIBI GENERIC - CARDIOLITE
11.0000 | Freq: Once | INTRAVENOUS | Status: AC | PRN
  Administered 2015-04-27: 11 via INTRAVENOUS

## 2015-04-27 MED ORDER — REGADENOSON 0.4 MG/5ML IV SOLN
0.4000 mg | Freq: Once | INTRAVENOUS | Status: AC
  Administered 2015-04-27: 0.4 mg via INTRAVENOUS

## 2015-04-27 NOTE — Telephone Encounter (Signed)
-----   Message from Sueanne Margarita, MD sent at 04/27/2015  4:23 PM EST ----- Please let patient know that stress test was fine

## 2015-05-15 ENCOUNTER — Ambulatory Visit: Payer: Self-pay | Admitting: Orthopedic Surgery

## 2015-05-15 NOTE — Progress Notes (Signed)
Preoperative surgical orders have been place into the Epic hospital system for Anne Obrien on 05/15/2015, 11:05 AM  by Mickel Crow for surgery on 06-14-2015.  Preop Total Hip - Anterior Approach orders including IV Tylenol, and IV Decadron as long as there are no contraindications to the above medications. [name], PA-C

## 2015-06-03 ENCOUNTER — Telehealth: Payer: Self-pay | Admitting: Oncology

## 2015-06-03 NOTE — Telephone Encounter (Signed)
Due to HB out 3/3 reschedule lab/fu. Per patient she would like to have her f/u prior to pending hip surgery and would like to be seen on day GM in office. Gave patient new appointment for lab/fu 3/1 @ 2:45 pm - date per patient.

## 2015-06-06 ENCOUNTER — Ambulatory Visit: Payer: Self-pay | Admitting: Orthopedic Surgery

## 2015-06-06 ENCOUNTER — Encounter (HOSPITAL_COMMUNITY): Payer: Self-pay

## 2015-06-06 ENCOUNTER — Other Ambulatory Visit (HOSPITAL_COMMUNITY): Payer: Medicare Other

## 2015-06-06 ENCOUNTER — Encounter (HOSPITAL_COMMUNITY)
Admission: RE | Admit: 2015-06-06 | Discharge: 2015-06-06 | Disposition: A | Discharge status: Home / Self Care | Payer: Medicare Other | Source: Ambulatory Visit | Attending: Orthopedic Surgery | Admitting: Orthopedic Surgery

## 2015-06-06 DIAGNOSIS — Z0183 Encounter for blood typing: Secondary | ICD-10-CM | POA: Insufficient documentation

## 2015-06-06 DIAGNOSIS — Z01812 Encounter for preprocedural laboratory examination: Secondary | ICD-10-CM | POA: Insufficient documentation

## 2015-06-06 DIAGNOSIS — M1611 Unilateral primary osteoarthritis, right hip: Secondary | ICD-10-CM | POA: Insufficient documentation

## 2015-06-06 HISTORY — DX: Bursopathy, unspecified: M71.9

## 2015-06-06 HISTORY — DX: Other specified postprocedural states: Z98.890

## 2015-06-06 HISTORY — DX: Gastro-esophageal reflux disease without esophagitis: K21.9

## 2015-06-06 HISTORY — DX: Atherosclerotic heart disease of native coronary artery without angina pectoris: I25.10

## 2015-06-06 HISTORY — DX: Age-related osteoporosis without current pathological fracture: M81.0

## 2015-06-06 HISTORY — DX: Asymptomatic varicose veins of unspecified lower extremity: I83.90

## 2015-06-06 HISTORY — DX: Nausea with vomiting, unspecified: R11.2

## 2015-06-06 HISTORY — DX: Other intervertebral disc degeneration, lumbar region without mention of lumbar back pain or lower extremity pain: M51.369

## 2015-06-06 HISTORY — DX: Personal history of other infectious and parasitic diseases: Z86.19

## 2015-06-06 HISTORY — DX: Asymptomatic menopausal state: Z78.0

## 2015-06-06 HISTORY — DX: Urinary tract infection, site not specified: N39.0

## 2015-06-06 HISTORY — DX: Unspecified hemorrhoids: K64.9

## 2015-06-06 HISTORY — DX: Other chronic cystitis without hematuria: N30.20

## 2015-06-06 HISTORY — DX: Nocturia: R35.1

## 2015-06-06 HISTORY — DX: Other intervertebral disc degeneration, lumbar region: M51.36

## 2015-06-06 HISTORY — DX: Other chronic pain: G89.29

## 2015-06-06 HISTORY — DX: Family history of other specified conditions: Z84.89

## 2015-06-06 LAB — URINE MICROSCOPIC-ADD ON: SQUAMOUS EPITHELIAL / LPF: NONE SEEN

## 2015-06-06 LAB — PROTIME-INR
INR: 1.09 (ref 0.00–1.49)
Prothrombin Time: 14.3 seconds (ref 11.6–15.2)

## 2015-06-06 LAB — SURGICAL PCR SCREEN
MRSA, PCR: NEGATIVE
STAPHYLOCOCCUS AUREUS: NEGATIVE

## 2015-06-06 LAB — CBC
HCT: 39.3 % (ref 36.0–46.0)
HEMOGLOBIN: 12.8 g/dL (ref 12.0–15.0)
MCH: 30.3 pg (ref 26.0–34.0)
MCHC: 32.6 g/dL (ref 30.0–36.0)
MCV: 92.9 fL (ref 78.0–100.0)
Platelets: 181 10*3/uL (ref 150–400)
RBC: 4.23 MIL/uL (ref 3.87–5.11)
RDW: 12.7 % (ref 11.5–15.5)
WBC: 7.3 10*3/uL (ref 4.0–10.5)

## 2015-06-06 LAB — COMPREHENSIVE METABOLIC PANEL
ALK PHOS: 52 U/L (ref 38–126)
ALT: 6 U/L — AB (ref 14–54)
AST: 23 U/L (ref 15–41)
Albumin: 4.1 g/dL (ref 3.5–5.0)
Anion gap: 8 (ref 5–15)
BUN: 16 mg/dL (ref 6–20)
CALCIUM: 9.3 mg/dL (ref 8.9–10.3)
CO2: 25 mmol/L (ref 22–32)
CREATININE: 0.73 mg/dL (ref 0.44–1.00)
Chloride: 105 mmol/L (ref 101–111)
Glucose, Bld: 91 mg/dL (ref 65–99)
Potassium: 4.5 mmol/L (ref 3.5–5.1)
Sodium: 138 mmol/L (ref 135–145)
Total Bilirubin: 0.7 mg/dL (ref 0.3–1.2)
Total Protein: 6.7 g/dL (ref 6.5–8.1)

## 2015-06-06 LAB — ABO/RH: ABO/RH(D): A POS

## 2015-06-06 LAB — URINALYSIS, ROUTINE W REFLEX MICROSCOPIC
Bilirubin Urine: NEGATIVE
GLUCOSE, UA: NEGATIVE mg/dL
KETONES UR: NEGATIVE mg/dL
NITRITE: POSITIVE — AB
PROTEIN: NEGATIVE mg/dL
Specific Gravity, Urine: 1.013 (ref 1.005–1.030)
pH: 5.5 (ref 5.0–8.0)

## 2015-06-06 LAB — APTT: aPTT: 30 seconds (ref 24–37)

## 2015-06-06 NOTE — Patient Instructions (Signed)
Anne Obrien  06/06/2015   Your procedure is scheduled on: Wednesday June 14, 2015  Report to [facility] Main  Entrance take [place]  elevators to 3rd floor to  [place] at 11:45 AM.  Call this number if you have problems the morning of surgery [phone]   Remember: ONLY 1 PERSON MAY GO WITH YOU TO SHORT STAY TO GET  READY MORNING OF [place].  Do not eat food After Midnight but may take clear liquids till 7:45 am then nothing by mouth day of surgery.      Take these medicines the morning of surgery with A SIP OF WATER: NONE                               You may not have any metal on your body including hair pins and              piercings  Do not wear jewelry, make-up, lotions, powders or perfumes, deodorant             Do not wear nail polish.  Do not shave  48 hours prior to surgery.               Do not bring valuables to the hospital. [place].  Contacts, dentures or bridgework may not be worn into surgery.  Leave suitcase in the car. After surgery it may be brought to your room.                Please read over the following fact sheets you were given:MRSA INFORMATION SHEET; INCENTIVE SPIROMETER; BLOOD TRANSFUSION INFORMATION SHEET  _____________________________________________________________________             [facility] - Preparing for Surgery Before surgery, you can play an important role.  Because skin is not sterile, your skin needs to be as free of germs as possible.  You can reduce the number of germs on your skin by washing with CHG (chlorahexidine gluconate) soap before surgery.  CHG is an antiseptic cleaner which kills germs and bonds with the skin to continue killing germs even after washing. Please DO NOT use if you have an allergy to CHG or antibacterial soaps.  If your skin becomes reddened/irritated stop using the CHG and inform your nurse when you arrive at Short  Stay. Do not shave (including legs and underarms) for at least 48 hours prior to the first CHG shower.  You may shave your face/neck. Please follow these instructions carefully:  1.  Shower with CHG Soap the night before surgery and the  morning of Surgery.  2.  If you choose to wash your hair, wash your hair first as usual with your  normal  shampoo.  3.  After you shampoo, rinse your hair and body thoroughly to remove the  shampoo.                           4.  Use CHG as you would any other liquid soap.  You can apply chg directly  to the skin and wash  Gently with a scrungie or clean washcloth.  5.  Apply the CHG Soap to your body ONLY FROM THE NECK DOWN.   Do not use on face/ open                           Wound or open sores. Avoid contact with eyes, ears mouth and genitals (private parts).                       Wash face,  Genitals (private parts) with your normal soap.             6.  Wash thoroughly, paying special attention to the area where your surgery  will be performed.  7.  Thoroughly rinse your body with warm water from the neck down.  8.  DO NOT shower/wash with your normal soap after using and rinsing off  the CHG Soap.                9.  Pat yourself dry with a clean towel.            10.  Wear clean pajamas.            11.  Place clean sheets on your bed the night of your first shower and do not  sleep with pets. Day of Surgery : Do not apply any lotions/deodorants the morning of surgery.  Please wear clean clothes to the hospital/surgery center.  FAILURE TO FOLLOW THESE INSTRUCTIONS MAY RESULT IN THE CANCELLATION OF YOUR SURGERY PATIENT SIGNATURE_________________________________  NURSE SIGNATURE__________________________________  ________________________________________________________________________   [name]  An incentive spirometer is a tool that can help keep your lungs clear and active. This tool measures how well you are  filling your lungs with each breath. Taking long deep breaths may help reverse or decrease the chance of developing breathing (pulmonary) problems (especially infection) following:  A long period of time when you are unable to move or be active. BEFORE THE PROCEDURE   If the spirometer includes an indicator to show your best effort, your nurse or respiratory therapist will set it to a desired goal.  If possible, sit up straight or lean slightly forward. Try not to slouch.  Hold the incentive spirometer in an upright position. INSTRUCTIONS FOR USE   Sit on the edge of your bed if possible, or sit up as far as you can in bed or on a chair.  Hold the incentive spirometer in an upright position.  Breathe out normally.  Place the mouthpiece in your mouth and seal your lips tightly around it.  Breathe in slowly and as deeply as possible, raising the piston or the ball toward the top of the column.  Hold your breath for 3-5 seconds or for as long as possible. Allow the piston or ball to fall to the bottom of the column.  Remove the mouthpiece from your mouth and breathe out normally.  Rest for a few seconds and repeat Steps 1 through 7 at least 10 times every 1-2 hours when you are awake. Take your time and take a few normal breaths between deep breaths.  The spirometer may include an indicator to show your best effort. Use the indicator as a goal to work toward during each repetition.  After each set of 10 deep breaths, practice coughing to be sure your lungs are clear. If you have an incision (the cut made at the time of surgery),  support your incision when coughing by placing a pillow or rolled up towels firmly against it. Once you are able to get out of bed, walk around indoors and cough well. You may stop using the incentive spirometer when instructed by your caregiver.  RISKS AND COMPLICATIONS  Take your time so you do not get dizzy or light-headed.  If you are in pain, you may  need to take or ask for pain medication before doing incentive spirometry. It is harder to take a deep breath if you are having pain. AFTER USE  Rest and breathe slowly and easily.  It can be helpful to keep track of a log of your progress. Your caregiver can provide you with a simple table to help with this. If you are using the spirometer at home, follow these instructions: [place] IF:   You are having difficultly using the spirometer.  You have trouble using the spirometer as often as instructed.  Your pain medication is not giving enough relief while using the spirometer.  You develop fever of 100.5 F (38.1 C) or higher. SEEK IMMEDIATE MEDICAL CARE IF:   You cough up bloody sputum that had not been present before.  You develop fever of 102 F (38.9 C) or greater.  You develop worsening pain at or near the incision site. MAKE SURE YOU:   Understand these instructions.  Will watch your condition.  Will get help right away if you are not doing well or get worse. Document Released: 08/05/2006 Document Revised: 06/17/2011 Document Reviewed: 10/06/2006 ExitCare Patient Information 2014 ExitCare, [place].   ________________________________________________________________________  WHAT IS A BLOOD TRANSFUSION? Blood Transfusion Information  A transfusion is the replacement of blood or some of its parts. Blood is made up of multiple cells which provide different functions.  Red blood cells carry oxygen and are used for blood loss replacement.  White blood cells fight against infection.  Platelets control bleeding.  Plasma helps clot blood.  Other blood products are available for specialized needs, such as hemophilia or other clotting disorders. BEFORE THE TRANSFUSION  Who gives blood for transfusions?   Healthy volunteers who are fully evaluated to make sure their blood is safe. This is blood bank blood. Transfusion therapy is the safest it has ever been in  the practice of medicine. Before blood is taken from a donor, a complete history is taken to make sure that person has no history of diseases nor engages in risky social behavior (examples are intravenous drug use or sexual activity with multiple partners). The donor's travel history is screened to minimize risk of transmitting infections, such as malaria. The donated blood is tested for signs of infectious diseases, such as HIV and hepatitis. The blood is then tested to be sure it is compatible with you in order to minimize the chance of a transfusion reaction. If you or a relative donates blood, this is often done in anticipation of surgery and is not appropriate for emergency situations. It takes many days to process the donated blood. RISKS AND COMPLICATIONS Although transfusion therapy is very safe and saves many lives, the main dangers of transfusion include:   Getting an infectious disease.  Developing a transfusion reaction. This is an allergic reaction to something in the blood you were given. Every precaution is taken to prevent this. The decision to have a blood transfusion has been considered carefully by your caregiver before blood is given. Blood is not given unless the benefits outweigh the risks. AFTER THE TRANSFUSION  Right after receiving a blood transfusion, you will usually feel much better and more energetic. This is especially true if your red blood cells have gotten low (anemic). The transfusion raises the level of the red blood cells which carry oxygen, and this usually causes an energy increase.  The nurse administering the transfusion will monitor you carefully for complications. HOME CARE INSTRUCTIONS  No special instructions are needed after a transfusion. You may find your energy is better. Speak with your caregiver about any limitations on activity for underlying diseases you may have. SEEK MEDICAL CARE IF:   Your condition is not improving after your  transfusion.  You develop redness or irritation at the intravenous (IV) site. SEEK IMMEDIATE MEDICAL CARE IF:  Any of the following symptoms occur over the next 12 hours:  Shaking chills.  You have a temperature by mouth above 102 F (38.9 C), not controlled by medicine.  Chest, back, or muscle pain.  People around you feel you are not acting correctly or are confused.  Shortness of breath or difficulty breathing.  Dizziness and fainting.  You get a rash or develop hives.  You have a decrease in urine output.  Your urine turns a dark color or changes to pink, red, or brown. Any of the following symptoms occur over the next 10 days:  You have a temperature by mouth above 102 F (38.9 C), not controlled by medicine.  Shortness of breath.  Weakness after normal activity.  The white part of the eye turns yellow (jaundice).  You have a decrease in the amount of urine or are urinating less often.  Your urine turns a dark color or changes to pink, red, or brown. Document Released: 03/22/2000 Document Revised: 06/17/2011 Document Reviewed: 11/09/2007 ExitCare Patient Information 2014 ExitCare, [place].  _______________________________________________________________________   CLEAR LIQUID DIET   Foods Allowed                                                                     Foods Excluded  Coffee and tea, regular and decaf                             liquids that you cannot  Plain Jell-O in any flavor                                             see through such as: Fruit ices (not with fruit pulp)                                     milk, soups, orange juice  Iced Popsicles                                    All solid food Carbonated beverages, regular and diet  Cranberry, grape and apple juices Sports drinks like Gatorade Lightly seasoned clear broth or consume(fat free) Sugar, honey syrup  Sample Menu Breakfast                                 Lunch                                     Supper Cranberry juice                    Beef broth                            Chicken broth Jell-O                                     Grape juice                           Apple juice Coffee or tea                        Jell-O                                      Popsicle                                                Coffee or tea                        Coffee or tea  _____________________________________________________________________

## 2015-06-06 NOTE — Progress Notes (Signed)
Clearance note per chart per Dr Drema Dallas 03/06/2015  OV note per chart per Dr Radford Pax 04/18/2015 Clearance per Dr Radford Pax epic 04/27/2015 Stress test results on chart per Dr Radford Pax 04/27/2015  EKG epic 04/18/2015  ECHO / epic 01/22/2013

## 2015-06-07 ENCOUNTER — Ambulatory Visit (HOSPITAL_BASED_OUTPATIENT_CLINIC_OR_DEPARTMENT_OTHER): Payer: Medicare Other | Admitting: Nurse Practitioner

## 2015-06-07 ENCOUNTER — Other Ambulatory Visit (HOSPITAL_BASED_OUTPATIENT_CLINIC_OR_DEPARTMENT_OTHER): Payer: Medicare Other

## 2015-06-07 ENCOUNTER — Encounter: Payer: Self-pay | Admitting: Nurse Practitioner

## 2015-06-07 VITALS — BP 121/69 | HR 78 | Temp 98.0°F | Resp 18 | Ht 62.0 in | Wt 151.3 lb

## 2015-06-07 DIAGNOSIS — C50912 Malignant neoplasm of unspecified site of left female breast: Secondary | ICD-10-CM

## 2015-06-07 DIAGNOSIS — M8580 Other specified disorders of bone density and structure, unspecified site: Secondary | ICD-10-CM | POA: Diagnosis not present

## 2015-06-07 DIAGNOSIS — Z170 Estrogen receptor positive status [ER+]: Secondary | ICD-10-CM | POA: Diagnosis not present

## 2015-06-07 DIAGNOSIS — Z853 Personal history of malignant neoplasm of breast: Secondary | ICD-10-CM

## 2015-06-07 LAB — CBC WITH DIFFERENTIAL/PLATELET
BASO%: 0.7 % (ref 0.0–2.0)
Basophils Absolute: 0 10*3/uL (ref 0.0–0.1)
EOS ABS: 0.2 10*3/uL (ref 0.0–0.5)
EOS%: 2.7 % (ref 0.0–7.0)
HEMATOCRIT: 37.4 % (ref 34.8–46.6)
HGB: 12.4 g/dL (ref 11.6–15.9)
LYMPH#: 1.5 10*3/uL (ref 0.9–3.3)
LYMPH%: 23.3 % (ref 14.0–49.7)
MCH: 29.5 pg (ref 25.1–34.0)
MCHC: 33.1 g/dL (ref 31.5–36.0)
MCV: 89.2 fL (ref 79.5–101.0)
MONO#: 0.6 10*3/uL (ref 0.1–0.9)
MONO%: 8.6 % (ref 0.0–14.0)
NEUT%: 64.7 % (ref 38.4–76.8)
NEUTROS ABS: 4.3 10*3/uL (ref 1.5–6.5)
PLATELETS: 179 10*3/uL (ref 145–400)
RBC: 4.19 10*6/uL (ref 3.70–5.45)
RDW: 12.4 % (ref 11.2–14.5)
WBC: 6.6 10*3/uL (ref 3.9–10.3)

## 2015-06-07 LAB — COMPREHENSIVE METABOLIC PANEL
ALK PHOS: 54 U/L (ref 40–150)
ANION GAP: 7 meq/L (ref 3–11)
AST: 16 U/L (ref 5–34)
Albumin: 3.5 g/dL (ref 3.5–5.0)
BILIRUBIN TOTAL: 0.35 mg/dL (ref 0.20–1.20)
BUN: 18.5 mg/dL (ref 7.0–26.0)
CALCIUM: 8.8 mg/dL (ref 8.4–10.4)
CO2: 27 mEq/L (ref 22–29)
CREATININE: 1.1 mg/dL (ref 0.6–1.1)
Chloride: 107 mEq/L (ref 98–109)
EGFR: 47 mL/min/{1.73_m2} — AB (ref >90)
Glucose: 94 mg/dl (ref 70–140)
Potassium: 4.2 mEq/L (ref 3.5–5.1)
Sodium: 140 mEq/L (ref 136–145)
TOTAL PROTEIN: 6.4 g/dL (ref 6.4–8.3)

## 2015-06-07 NOTE — Progress Notes (Signed)
[place]  [phone] [phone]  OFFICE PROGRESS NOTE   ID: Anne [place]   DOB: 1940/05/09  MR#: [id number]  WNI#:[id number]   PCP: Anne Heck, MD GYN:  SU: Anne Bookbinder, MD RAD ONCJodelle Obrien, M.D.   HISTORY OF PRESENT ILLNESS: From Dr. Collier Salina Obrien's new patient evaluation note dated 03/21/2010:  "This is a delightful 75 year old woman from [facility] referred by Dr. Donne Obrien for evaluation and treatment of breast cancer. 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  She really had not had any previous screening mammograms.  She had her first mammogram in October 2010.  A six-month followup is recommended because of some calcifications.  In April 2011, a second mammogram was performed, which showed stable calcifications and another six-month followup was recommended, which was performed in October 2011.  Stereotactic biopsies of calcifications were recommended.  A biopsy performed on February 02, 2010 showed fibrocystic changes in the right breast and in the left breast invasive ductal cancer with associated DCIS was seen.  This is ER/PR positive 100% respectively.  HER-2 was negative and proliferative index was 30%.  This patient subsequently had an MRI scan on 02/08/2010 biopsy changes were seen and no other abnormalities were seen.  The patient underwent lumpectomy, sentinel lymph node evaluation on 03/07/2010 final pathology showed 0.5 cm invasive ductal cancer.  A total of eight lymph nodes were removed with sentinel lymph node and these were all negative for malignancy.  Invasive disease was grade 2 with extensive DCIS.  No angiolymphatic invasion was seen.  Resection margins were clear.  The patient has had an unremarkable postoperative course."    Her subsequent history is as detailed below.    INTERVAL HISTORY: "Anne Obrien" returns today for followup of her breast  cancer. She has been on tamoxifen since February 2012 and has few issues with this drug. Her hot flashes are rare and her vaginal wetness is minimal. The interval history is generally unremarkable.  REVIEW OF SYSTEMS: Anne Obrien is in need of a right hip replacement. This will take place late next week. She will have several weeks of physical therapy to follow. This is currently her only source of pain. Her energy level is low and she naps daily. Otherwise she denies any other physical complaints. She has no fevers, chills, nausea, vomiting, or changes in bowel or bladder habits. Her appetite is good. She denies shortness of breath, chest pain, cough, or palpitations. She denies headaches, dizziness, or vision changes. A detailed review of systems is otherwise stable.  PAST MEDICAL HISTORY: Past Medical History  Diagnosis Date  . Bundle branch block   . Hepatitis A 1976  . Cancer [facility])     face  . Breast cancer ([place])     stage I left  . Arthritis   . Malaria   . Osteopenia   . Malaria   . PONV (postoperative nausea and vomiting)   . Chronic cystitis   . Chronic pain   . Coronary artery disease   . Family history of adverse reaction to anesthesia     pt states her son has to have large amount of anesthesia - difficulty to go under   . GERD (gastroesophageal reflux disease)   . Osteoporosis   . Hemorrhoids   . Varicose veins   . Urinary tract infection   . Menopause   . Bursitis   . Degenerative disc disease, lumbar   .  History of measles   . History of mumps   . 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  She has a history of left bundle-branch block seen by Dr. Mare Obrien.     PAST SURGICAL HISTORY: Past Surgical History  Procedure Laterality Date  . Vaginal hysterectomy  may 2008    and vaginal repair  . Breast lumpectomy  02/2010    left lumpectomy and sentinel node  . Skin cancer destruction      Nose  cryotherapy  . Bariatric surgery N/A   Includes hysterectomy, ovaries were left in, this was at the age of 44.   FAMILY HISTORY Family History  Problem Relation Age of Onset  . Breast cancer Mother   . Arthritis Mother   . Breast cancer Maternal Grandmother   . Breast cancer Maternal Aunt   . Pernicious anemia Father   . Arthritis Sister   . Arthritis Brother   Mother has a history of breast cancer.  Maternal grandmother has a history of breast cancer.  Maternal aunt had some form of malignancy.  Father died at age 25.  She has two brothers and two sisters.  No history of breast cancer.   GYNECOLOGIC HISTORY: She is G9, P6 with three miscarriages.  She had menarche at age 37.  No history of birth control pill use.    SOCIAL HISTORY: The patient has been married  since 71.  They met while they were in college in [place].  Her husband is  [place] native.  They have 4 adult daughters, 2 adult sons, 3 grandchildren that live in [place], 3 grandchildren that live in [place], and a set of newborn twins that reside in [place], [place]. She is originally from [place], born and brought up in [place], [place]. 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  The patient also notes that her husband has lost his short-term memory.  In her spare time, [name] enjoys going for walks and spending time with her family.   ADVANCED DIRECTIVES: Not on file  HEALTH MAINTENANCE: Social History  Substance Use Topics  . Smoking status: Never Smoker   . Smokeless tobacco: Never Used  . Alcohol Use: No    Colonoscopy:  07/2011 PAP: Not on file Bone density: Repeat DEXA scan shows further bone density loss, per patient report Lipid panel: Not on file  No Known Allergies  Current Outpatient Prescriptions  Medication Sig Dispense Refill  . aspirin 81 MG tablet Take  81 mg by mouth every evening.     . calcium carbonate (OS-CAL) 600 MG TABS Take 600 mg by mouth 2 (two) times daily with a meal.    . cholecalciferol (VITAMIN D) 1000 UNITS tablet Take 1,000 Units by mouth 2 (two) times daily.    . fish oil-omega-3 fatty acids 1000 MG capsule Take 1 g by mouth 3 (three) times daily.     Marland Kitchen glucosamine-chondroitin 500-400 MG tablet Take 1 tablet by mouth 2 (two) times daily. Triple strength    . Multiple Vitamin (MULTIVITAMIN) tablet Take 0.5 tablets by mouth 2 (two) times daily.     . naproxen sodium (ALEVE) 220 MG tablet Take 220 mg by mouth 2 (two) times daily as needed (pain).     . tamoxifen (NOLVADEX) 20 MG tablet Take  1 tablet (20 mg total) by mouth daily. (Patient taking differently: Take 20 mg by mouth at bedtime. ) 90 tablet 4   No current facility-administered medications for this visit.    OBJECTIVE: Middle-aged white woman who appears stated age 52 Vitals:   06/07/15 1444  BP: 121/69  Pulse: 78  Temp: 98 F (36.7 C)  Resp: 18     Body mass index is 27.67 kg/(m^2).      ECOG FS: 1 - Symptomatic but completely ambulatory  Skin: warm, dry  HEENT: sclerae anicteric, conjunctivae pink, oropharynx clear. No thrush or mucositis.  Lymph Nodes: No cervical or supraclavicular lymphadenopathy  Lungs: clear to auscultation bilaterally, no rales, wheezes, or rhonci  Heart: regular rate and rhythm  Abdomen: round, soft, non tender, positive bowel sounds  Musculoskeletal: No focal spinal tenderness, no peripheral edema  Neuro: non focal, well oriented, positive affect  Breasts: left breast status post lumpectomy and radiation. No evidence of recurrent disease. Left axilla benign. Right breast unremarkable.    LAB RESULTS: Lab Results  Component Value Date   WBC 6.6 06/07/2015   NEUTROABS 4.3 06/07/2015   HGB 12.4 06/07/2015   HCT 37.4 06/07/2015   MCV 89.2 06/07/2015   PLT 179 06/07/2015      Chemistry      Component Value Date/Time    NA 140 06/07/2015 1414   NA 138 06/06/2015 1500   K 4.2 06/07/2015 1414   K 4.5 06/06/2015 1500   CL 105 06/06/2015 1500   CL 107 09/15/2012 1236   CO2 27 06/07/2015 1414   CO2 25 06/06/2015 1500   BUN 18.5 06/07/2015 1414   BUN 16 06/06/2015 1500   CREATININE 1.1 06/07/2015 1414   CREATININE 0.73 06/06/2015 1500      Component Value Date/Time   CALCIUM 8.8 06/07/2015 1414   CALCIUM 9.3 06/06/2015 1500   ALKPHOS 54 06/07/2015 1414   ALKPHOS 52 06/06/2015 1500   AST 16 06/07/2015 1414   AST 23 06/06/2015 1500   ALT <9 06/07/2015 1414   ALT 6* 06/06/2015 1500   BILITOT 0.35 06/07/2015 1414   BILITOT 0.7 06/06/2015 1500       Lab Results  Component Value Date   LABCA2 17 09/05/2011    Urinalysis    Component Value Date/Time   COLORURINE YELLOW 06/06/2015 1428    STUDIES: EXAM: DIGITAL DIAGNOSTIC BILATERAL MAMMOGRAM WITH 3D TOMOSYNTHESIS AND CAD  LEFT BREAST ULTRASOUND  COMPARISON: Previous exam(s).  ACR Breast Density Category b: There are scattered areas of fibroglandular density.  FINDINGS: No suspicious masses or calcifications are seen in either breast. Postsurgical changes are present in the slightly upper outer left breast related to prior lumpectomy. A spot compression magnification MLO view of the lumpectomy site in the left breast was performed. There is no mammographic evidence of locally recurrent malignancy. A spot compression tangential view of was also performed at site of palpable concern in the upper-outer left breast with no mammographic abnormality seen in this location.  Mammographic images were processed with CAD.  Physical examination at site of palpable concern in the upper-outer left breast just superior to the lumpectomy scar reveals firm tissue.  Targeted ultrasound of the left breast was performed. No suspicious masses or abnormalities are seen, only a postsurgical scar is present with linear shadowing hypoechoic  tissue connecting up to the skin surface at 2 o'clock 4 cm from the nipple. The entire upper inner left breast was scanned.  IMPRESSION: 1. No mammographic  or sonographic correlate for the palpable area of concern in the upper-outer left breast, only scar tissue is visualized in this location.  2. No mammographic evidence of malignancy in either breast.  RECOMMENDATION: 1. Recommend further management of the left breast palpable abnormality be based on clinical assessment.  2. Diagnostic mammogram is suggested in 1 year. (Code:[id number]-B-01Y)  I have discussed the findings and recommendations with the patient. Results were also provided in writing at the conclusion of the visit. If applicable, a reminder letter will be sent to the patient regarding the next appointment.  BI-RADS CATEGORY 2: Benign.   Electronically Signed  By: Everlean Alstrom M.D.  On: 02/21/2015 11:16  ASSESSMENT: 75 y.o. BRCA negative [place], [place] woman:  1.   Status post left breast lumpectomy with left axillary sentinel node biopsy on 03/07/2010 for a  pT1a pN0 (i-) (sn-), stage I  invasive ductal carcinoma, grade 2, estrogen receptor 100% positive, progesterone receptor 100% positive, Ki-67 30%, HER-2/neu by CISH no amplification   2.  status post adjuvant radiation therapy from 04/11/2010 to 05/09/2010.  3.  started antiestrogen therapy with Tamoxifen in 05/2010.  PLAN: [place] has done well as far as her breast cancer is concerned. She is now over 5 years out from her definitive surgery with no evidence of recurrent disease. Not only that but she has completed her 5 years of antiestrogen therapy as well. At this point she can stop this pill and "graduate" from follow up here at the cancer center.   Of course, Anne Obrien is still our patient. We maintain her records here for a number of years. She is simply not required to have routine visits here. We are turning her back over to her PCP Dr.  Drema Dallas who will be responsible for clinical breast exams and following her mammograms on an annual basis. Her next mammogram will be due in November 2017. We are still available for consult should any questions or concerns arise.   Anne Obrien understands and agrees with this plan. She has been encouraged to call if we may be of help to her. It has been a pleasure working with this patient.   Laurie Panda, NP  06/07/2015 4:14 PM

## 2015-06-07 NOTE — Progress Notes (Signed)
Urinalysis and micro results in epic per PAT visit 06/06/2015 sent to Dr Wynelle Link

## 2015-06-08 ENCOUNTER — Ambulatory Visit: Payer: Self-pay | Admitting: Oncology

## 2015-06-08 ENCOUNTER — Other Ambulatory Visit: Payer: Medicare Other

## 2015-06-09 ENCOUNTER — Other Ambulatory Visit: Payer: Medicare Other

## 2015-06-09 ENCOUNTER — Ambulatory Visit: Payer: Self-pay | Admitting: Nurse Practitioner

## 2015-06-13 ENCOUNTER — Ambulatory Visit: Payer: Self-pay | Admitting: Orthopedic Surgery

## 2015-06-13 NOTE — H&P (Signed)
[place]rien DOB: [birth year] Married / Language: English / Race: White Female Date of Admission:  06/14/2015 CC: Right Hip Pain History of Present Illness The patient is a 75 year old female who comes in  for a preoperative History and Physical. The patient is scheduled for a right total hip arthroplasty (anterior) to be performed by Dr. Dione Plover. Aluisio, MD at [facility] on 06-14-2015. The patient is a 75 year old female who presents with a hip problem. The patient is here today for a second opinion.The patient reports right hip problems including pain, stiffness and difficulty getting up from a seated position symptoms that have been present for 9 month(s). The symptoms began without any known injury. The patient reports symptoms radiating to the: right groin. Current treatment includes nonsteroidal anti-inflammatory drugs (Aleve). Prior to being seen today the patient was previously evaluated by a colleague. Previous workup for this problem has included hip x-rays (ordered by Dr. Drema Dallas; done on 07/20/14; pulled up from the [facility] system) and spinal MRI (ordered by Dr. Ellene Route). Previous treatment for this problem has included corticosteroid injection (intra-articular injection on June 27,2016. She reports signifant relief with injection; but states the pain has started to come back) and physical therapy. Her right hip has gotten progressively worse over at a nine to ten months time span. She has not had any injury leading to this pain. She developed acute pain in her right groin radiating into her thigh. She saw her physical therapist and was doing some therapy for her hip and it was getting a little better. She was subsequently told that her problem was coming from her back and she got sent to Dr. Ellene Route. She had a back injection, but he felt that the problem potentially is somewhere from her hip. She had intra-articular injection in June and it did help until recently. Pain has come back. It  is starting to get worse, but still not as bad as it was prior to the injection. It is limiting what she can and cannot do. She does not have any left hip pain. She is not currently having any knee problems. She does have chronic back pain, but it is non-radicular in nature. She states that the hip has become increasing problematic and she would like to get back to her activities. She is ready to get the hip fixed. They have been treated conservatively in the past for the above stated problem and despite conservative measures, they continue to have progressive pain and severe functional limitations and dysfunction. They have failed non-operative management including home exercise, medications, and injections. It is felt that they would benefit from undergoing total joint replacement. Risks and benefits of the procedure have been discussed with the patient and they elect to proceed with surgery. There are no active contraindications to surgery such as ongoing infection or rapidly progressive neurological disease.  Problem List/Past Medical Primary osteoarthritis of right hip (M16.11)  Breast Cancer  Chronic Cystitis  Chronic Pain  Coronary artery disease  Gastroesophageal Reflux Disease  Hepatitis A  Osteoarthritis  Osteoporosis  Hemorrhoids  Varicose veins  Degenerative Disc Disease  Menopause  Measles  Mumps  Bundle Branch Block  Incomplete Malaria  Allergies  No Known Drug Allergies   Family History  Cancer  mother and grandmother mothers side Cerebrovascular Accident  brother Hypertension  mother and brother Osteoarthritis  mother Osteoporosis  mother Rheumatoid Arthritis  father  Social History Alcohol use  never consumed alcohol Children  5  or more Current work status  retired Engineer, agricultural (Currently)  no Drug/Alcohol Rehab (Previously)  no Exercise  Exercises weekly; does running / walking Illicit drug use  no Living situation  live  with spouse Marital status  married Number of flights of stairs before winded  2-3 Pain Contract  no Tobacco use  never smoker Advance Directives  Living Will  Medication History  Aleve (220MG  Tablet, Oral) Active. Tamoxifen Citrate (20MG  Tablet, Oral) Active. Multivitamin (Oral) Active. Glucosamine (Oral) Specific strength unknown - Active. Calcium Citrate + (Oral) Specific strength unknown - Active. Collagen (Oral) Specific strength unknown - Active. Vitamin D (Oral) Specific strength unknown - Active. Fish Oil (Oral) Specific strength unknown - Active.    Past Surgical History Breast Biopsy  bilateral Breast Mass; Local Excision  left Hysterectomy  partial (non-cancerous)   Review of Systems General Not Present- Chills, Fatigue, Fever, Memory Loss, Night Sweats, Weight Gain and Weight Loss. Skin Not Present- Eczema, Hives, Itching, Lesions and Rash. HEENT Not Present- Dentures, Double Vision, Headache, Hearing Loss, Tinnitus and Visual Loss. Respiratory Not Present- Allergies, Chronic Cough, Coughing up blood, Shortness of breath at rest and Shortness of breath with exertion. Cardiovascular Not Present- Chest Pain, Difficulty Breathing Lying Down, Murmur, Palpitations, Racing/skipping heartbeats and Swelling. Gastrointestinal Not Present- Abdominal Pain, Bloody Stool, Constipation, Diarrhea, Difficulty Swallowing, Heartburn, Jaundice, Loss of appetitie, Nausea and Vomiting. Female Genitourinary Present- Urinating at Night. Not Present- Blood in Urine, Discharge, Flank Pain, Incontinence, Painful Urination, Urgency, Urinary frequency, Urinary Retention and Weak urinary stream. Musculoskeletal Present- Back Pain, Joint Pain, Morning Stiffness and Muscle Pain. Not Present- Joint Swelling, Muscle Weakness and Spasms. Neurological Not Present- Blackout spells, Difficulty with balance, Dizziness, Paralysis, Tremor and Weakness. Psychiatric Not Present-  Insomnia.  Vitals Weight: 150 lb Height: 62in Body Surface Area: 1.69 m Body Mass Index: 27.44 kg/m  Pulse: 76 (Regular)  Resp.: 14 (Unlabored)  BP: 142/82 (Sitting, Right Arm, Standard)  Physical Exam General Mental Status -Alert, cooperative and good historian. General Appearance-pleasant, Not in acute distress. Orientation-Oriented X3. Build & Nutrition-Well nourished and Well developed.  Head and Neck Head-normocephalic, atraumatic . Neck Global Assessment - supple, no bruit auscultated on the right, no bruit auscultated on the left.  Eye Vision-Wears corrective lenses. Pupil - Bilateral-Regular and Round. Motion - Bilateral-EOMI.  Chest and Lung Exam Auscultation Breath sounds - clear at anterior chest wall and clear at posterior chest wall. Adventitious sounds - No Adventitious sounds.  Cardiovascular Auscultation Rhythm - Regular rate and rhythm. Heart Sounds - S1 WNL and S2 WNL. Murmurs & Other Heart Sounds - Auscultation of the heart reveals - No Murmurs.  Abdomen Palpation/Percussion Tenderness - Abdomen is non-tender to palpation. Rigidity (guarding) - Abdomen is soft. Auscultation Auscultation of the abdomen reveals - Bowel sounds normal.  Female Genitourinary Note: Not done, not pertinent to present illness   Musculoskeletal Note: On exam, very pleasant well-developed female, alert and oriented, in no apparent distress. Her left hip shows normal range of motion, no discomfort. Right hip shows flexion to 110, no internal rotation, about 10 to 20 degrees of external rotation, about 40 degrees of abduction. Her right knee exam shows some crepitus on range of motion, ranged about 0 to 135. No medial or lateral tenderness or instability. Pulse sensation and motor intact both lower extremities. Slightly antalgic gait on the right.  RADIOGRAPHS Reviewed her radiographs in April of this year and she had moderate joint space narrowing  in the right hip at that time.  I repeated the AP pelvis and lateral right hip today and she is now bone-on-bone with a subchondral cyst in the acetabulum.  Assessment & Plan Primary osteoarthritis of right hip (M16.11)  Note:Surgical Plans: Right Total Hip Replacement - Anterior Approach  Disposition: Home with daughters  PCP: Dr. Leighton Ruff - Patient has been seen preoperatively and felt to be stable for surgery Cards: Dr. Radford Pax - Patient has been seen preoperatively and felt to be stable for surgery. She had a recent stress test.  Topical TXA - Breast Cancer  Anesthesia Issues: Nausea  AVOID LEFT ARM FOR BP'S AND IV'S  Signed electronically by Ok Edwards, III PA-C

## 2015-06-14 ENCOUNTER — Inpatient Hospital Stay (HOSPITAL_COMMUNITY): Payer: Medicare Other | Admitting: Anesthesiology

## 2015-06-14 ENCOUNTER — Encounter (HOSPITAL_COMMUNITY): Payer: Self-pay | Admitting: *Deleted

## 2015-06-14 ENCOUNTER — Inpatient Hospital Stay (HOSPITAL_COMMUNITY): Payer: Medicare Other

## 2015-06-14 ENCOUNTER — Inpatient Hospital Stay (HOSPITAL_COMMUNITY)
Admission: RE | Admit: 2015-06-14 | Discharge: 2015-06-16 | DRG: 470 | Disposition: A | Discharge status: Home Health | Payer: Medicare Other | Source: Ambulatory Visit | Attending: Orthopedic Surgery | Admitting: Orthopedic Surgery

## 2015-06-14 ENCOUNTER — Encounter (HOSPITAL_COMMUNITY)
Admission: RE | Disposition: A | Discharge status: Home Health | Payer: Self-pay | Source: Ambulatory Visit | Attending: Orthopedic Surgery

## 2015-06-14 DIAGNOSIS — M169 Osteoarthritis of hip, unspecified: Secondary | ICD-10-CM | POA: Diagnosis present

## 2015-06-14 DIAGNOSIS — G8929 Other chronic pain: Secondary | ICD-10-CM | POA: Diagnosis present

## 2015-06-14 DIAGNOSIS — I454 Nonspecific intraventricular block: Secondary | ICD-10-CM | POA: Diagnosis present

## 2015-06-14 DIAGNOSIS — Z853 Personal history of malignant neoplasm of breast: Secondary | ICD-10-CM

## 2015-06-14 DIAGNOSIS — M810 Age-related osteoporosis without current pathological fracture: Secondary | ICD-10-CM | POA: Diagnosis present

## 2015-06-14 DIAGNOSIS — M1611 Unilateral primary osteoarthritis, right hip: Principal | ICD-10-CM | POA: Diagnosis present

## 2015-06-14 DIAGNOSIS — M549 Dorsalgia, unspecified: Secondary | ICD-10-CM | POA: Diagnosis present

## 2015-06-14 DIAGNOSIS — K219 Gastro-esophageal reflux disease without esophagitis: Secondary | ICD-10-CM | POA: Diagnosis present

## 2015-06-14 DIAGNOSIS — I2510 Atherosclerotic heart disease of native coronary artery without angina pectoris: Secondary | ICD-10-CM | POA: Diagnosis present

## 2015-06-14 DIAGNOSIS — Z85828 Personal history of other malignant neoplasm of skin: Secondary | ICD-10-CM | POA: Diagnosis not present

## 2015-06-14 DIAGNOSIS — B159 Hepatitis A without hepatic coma: Secondary | ICD-10-CM | POA: Diagnosis present

## 2015-06-14 DIAGNOSIS — Z96649 Presence of unspecified artificial hip joint: Secondary | ICD-10-CM

## 2015-06-14 HISTORY — PX: TOTAL HIP ARTHROPLASTY: SHX124

## 2015-06-14 LAB — TYPE AND SCREEN
ABO/RH(D): A POS
Antibody Screen: NEGATIVE

## 2015-06-14 SURGERY — ARTHROPLASTY, HIP, TOTAL, ANTERIOR APPROACH
Anesthesia: Spinal | Site: Hip | Laterality: Right

## 2015-06-14 MED ORDER — PHENYLEPHRINE HCL 10 MG/ML IJ SOLN
INTRAMUSCULAR | Status: AC
  Filled 2015-06-14: qty 1

## 2015-06-14 MED ORDER — TRANEXAMIC ACID 1000 MG/10ML IV SOLN: 2000.0000 mg | Freq: Once | INTRAVENOUS | Status: DC

## 2015-06-14 MED ORDER — TRAMADOL HCL 50 MG PO TABS: 50.0000 mg | ORAL_TABLET | Freq: Four times a day (QID) | ORAL | Status: DC | PRN

## 2015-06-14 MED ORDER — METHOCARBAMOL 500 MG PO TABS
500.0000 mg | ORAL_TABLET | Freq: Four times a day (QID) | ORAL | Status: DC | PRN
  Administered 2015-06-15: 500 mg via ORAL
  Filled 2015-06-14: qty 1

## 2015-06-14 MED ORDER — DIPHENHYDRAMINE HCL 12.5 MG/5ML PO ELIX: 12.5000 mg | ORAL_SOLUTION | ORAL | Status: DC | PRN

## 2015-06-14 MED ORDER — ONDANSETRON HCL 4 MG/2ML IJ SOLN
INTRAMUSCULAR | Status: AC
  Filled 2015-06-14: qty 2

## 2015-06-14 MED ORDER — DOCUSATE SODIUM 100 MG PO CAPS
100.0000 mg | ORAL_CAPSULE | Freq: Two times a day (BID) | ORAL | Status: DC
  Administered 2015-06-14 – 2015-06-16 (×4): 100 mg via ORAL

## 2015-06-14 MED ORDER — PHENOL 1.4 % MT LIQD: 1.0000 | OROMUCOSAL | Status: DC | PRN

## 2015-06-14 MED ORDER — TRANEXAMIC ACID 1000 MG/10ML IV SOLN
2000.0000 mg | Freq: Once | INTRAVENOUS | Status: DC
  Filled 2015-06-14: qty 20

## 2015-06-14 MED ORDER — ACETAMINOPHEN 500 MG PO TABS
1000.0000 mg | ORAL_TABLET | Freq: Four times a day (QID) | ORAL | Status: AC
  Administered 2015-06-14 – 2015-06-15 (×4): 1000 mg via ORAL
  Filled 2015-06-14 (×5): qty 2

## 2015-06-14 MED ORDER — DEXAMETHASONE SODIUM PHOSPHATE 10 MG/ML IJ SOLN
INTRAMUSCULAR | Status: AC
  Filled 2015-06-14: qty 1

## 2015-06-14 MED ORDER — BISACODYL 10 MG RE SUPP: 10.0000 mg | Freq: Every day | RECTAL | Status: DC | PRN

## 2015-06-14 MED ORDER — SODIUM CHLORIDE 0.9 % IV SOLN: INTRAVENOUS | Status: DC

## 2015-06-14 MED ORDER — BUPIVACAINE HCL (PF) 0.25 % IJ SOLN
INTRAMUSCULAR | Status: DC | PRN
  Administered 2015-06-14: 30 mL

## 2015-06-14 MED ORDER — ACETAMINOPHEN 10 MG/ML IV SOLN
1000.0000 mg | Freq: Once | INTRAVENOUS | Status: AC
  Administered 2015-06-14: 1000 mg via INTRAVENOUS

## 2015-06-14 MED ORDER — CEFAZOLIN SODIUM-DEXTROSE 2-3 GM-% IV SOLR
INTRAVENOUS | Status: AC
  Filled 2015-06-14: qty 50

## 2015-06-14 MED ORDER — LACTATED RINGERS IV SOLN
INTRAVENOUS | Status: DC | PRN
  Administered 2015-06-14: 12:00:00 via INTRAVENOUS

## 2015-06-14 MED ORDER — HYDROMORPHONE HCL 1 MG/ML IJ SOLN
0.2500 mg | INTRAMUSCULAR | Status: DC | PRN
  Administered 2015-06-14: 0.5 mg via INTRAVENOUS

## 2015-06-14 MED ORDER — DEXAMETHASONE SODIUM PHOSPHATE 10 MG/ML IJ SOLN
10.0000 mg | Freq: Once | INTRAMUSCULAR | Status: AC
  Administered 2015-06-15: 10 mg via INTRAVENOUS
  Filled 2015-06-14 (×2): qty 1

## 2015-06-14 MED ORDER — BUPIVACAINE HCL (PF) 0.25 % IJ SOLN
INTRAMUSCULAR | Status: AC
  Filled 2015-06-14: qty 30

## 2015-06-14 MED ORDER — POLYETHYLENE GLYCOL 3350 17 G PO PACK: 17.0000 g | PACK | Freq: Every day | ORAL | Status: DC | PRN

## 2015-06-14 MED ORDER — FENTANYL CITRATE (PF) 100 MCG/2ML IJ SOLN
INTRAMUSCULAR | Status: AC
  Filled 2015-06-14: qty 2

## 2015-06-14 MED ORDER — ACETAMINOPHEN 10 MG/ML IV SOLN
INTRAVENOUS | Status: AC
  Filled 2015-06-14: qty 100

## 2015-06-14 MED ORDER — MORPHINE SULFATE (PF) 10 MG/ML IV SOLN: 1.0000 mg | INTRAVENOUS | Status: DC | PRN

## 2015-06-14 MED ORDER — CEFAZOLIN SODIUM-DEXTROSE 2-3 GM-% IV SOLR
2.0000 g | INTRAVENOUS | Status: AC
  Administered 2015-06-14: 2 g via INTRAVENOUS

## 2015-06-14 MED ORDER — SODIUM CHLORIDE 0.9 % IV SOLN
INTRAVENOUS | Status: DC
  Administered 2015-06-14: 16:00:00 via INTRAVENOUS

## 2015-06-14 MED ORDER — HYDROMORPHONE HCL 1 MG/ML IJ SOLN
INTRAMUSCULAR | Status: AC
  Filled 2015-06-14: qty 1

## 2015-06-14 MED ORDER — LACTATED RINGERS IV SOLN: INTRAVENOUS | Status: DC

## 2015-06-14 MED ORDER — FENTANYL CITRATE (PF) 100 MCG/2ML IJ SOLN
INTRAMUSCULAR | Status: DC | PRN
  Administered 2015-06-14: 100 ug via INTRAVENOUS

## 2015-06-14 MED ORDER — PROPOFOL 10 MG/ML IV BOLUS
INTRAVENOUS | Status: AC
  Filled 2015-06-14: qty 20

## 2015-06-14 MED ORDER — RIVAROXABAN 10 MG PO TABS
10.0000 mg | ORAL_TABLET | Freq: Every day | ORAL | Status: DC
  Administered 2015-06-15 – 2015-06-16 (×2): 10 mg via ORAL
  Filled 2015-06-14 (×4): qty 1

## 2015-06-14 MED ORDER — ACETAMINOPHEN 650 MG RE SUPP: 650.0000 mg | Freq: Four times a day (QID) | RECTAL | Status: DC | PRN

## 2015-06-14 MED ORDER — MENTHOL 3 MG MT LOZG: 1.0000 | LOZENGE | OROMUCOSAL | Status: DC | PRN

## 2015-06-14 MED ORDER — CHLORHEXIDINE GLUCONATE 4 % EX LIQD: 60.0000 mL | Freq: Once | CUTANEOUS | Status: DC

## 2015-06-14 MED ORDER — MORPHINE SULFATE (PF) 2 MG/ML IV SOLN
1.0000 mg | INTRAVENOUS | Status: DC | PRN
Start: 2015-06-14 — End: 2015-06-16

## 2015-06-14 MED ORDER — PROPOFOL 500 MG/50ML IV EMUL
INTRAVENOUS | Status: DC | PRN
  Administered 2015-06-14: 100 ug/kg/min via INTRAVENOUS

## 2015-06-14 MED ORDER — ONDANSETRON HCL 4 MG PO TABS: 4.0000 mg | ORAL_TABLET | Freq: Four times a day (QID) | ORAL | Status: DC | PRN

## 2015-06-14 MED ORDER — PROPOFOL 10 MG/ML IV BOLUS
INTRAVENOUS | Status: AC
  Filled 2015-06-14: qty 40

## 2015-06-14 MED ORDER — METOCLOPRAMIDE HCL 5 MG/ML IJ SOLN: 5.0000 mg | Freq: Three times a day (TID) | INTRAMUSCULAR | Status: DC | PRN

## 2015-06-14 MED ORDER — MIDAZOLAM HCL 2 MG/2ML IJ SOLN
INTRAMUSCULAR | Status: AC
  Filled 2015-06-14: qty 2

## 2015-06-14 MED ORDER — DEXAMETHASONE SODIUM PHOSPHATE 10 MG/ML IJ SOLN
10.0000 mg | Freq: Once | INTRAMUSCULAR | Status: AC
  Administered 2015-06-14: 10 mg via INTRAVENOUS

## 2015-06-14 MED ORDER — ONDANSETRON HCL 4 MG/2ML IJ SOLN
INTRAMUSCULAR | Status: DC | PRN
  Administered 2015-06-14: 4 mg via INTRAVENOUS

## 2015-06-14 MED ORDER — METHOCARBAMOL 1000 MG/10ML IJ SOLN
500.0000 mg | Freq: Four times a day (QID) | INTRAVENOUS | Status: DC | PRN
  Administered 2015-06-14: 500 mg via INTRAVENOUS
  Filled 2015-06-14 (×2): qty 5

## 2015-06-14 MED ORDER — FLEET ENEMA 7-19 GM/118ML RE ENEM: 1.0000 | ENEMA | Freq: Once | RECTAL | Status: DC | PRN

## 2015-06-14 MED ORDER — ACETAMINOPHEN 325 MG PO TABS
650.0000 mg | ORAL_TABLET | Freq: Four times a day (QID) | ORAL | Status: DC | PRN
  Administered 2015-06-15 – 2015-06-16 (×2): 650 mg via ORAL
  Filled 2015-06-14 (×2): qty 2

## 2015-06-14 MED ORDER — CEFAZOLIN SODIUM-DEXTROSE 2-3 GM-% IV SOLR
2.0000 g | Freq: Four times a day (QID) | INTRAVENOUS | Status: AC
  Administered 2015-06-14 – 2015-06-15 (×2): 2 g via INTRAVENOUS
  Filled 2015-06-14 (×2): qty 50

## 2015-06-14 MED ORDER — BUPIVACAINE HCL (PF) 0.5 % IJ SOLN
INTRAMUSCULAR | Status: AC
  Filled 2015-06-14: qty 30

## 2015-06-14 MED ORDER — BUPIVACAINE HCL (PF) 0.5 % IJ SOLN
INTRAMUSCULAR | Status: DC | PRN
  Administered 2015-06-14: 15 mg via INTRATHECAL

## 2015-06-14 MED ORDER — OXYCODONE HCL 5 MG PO TABS
5.0000 mg | ORAL_TABLET | ORAL | Status: DC | PRN
  Administered 2015-06-14: 5 mg via ORAL
  Administered 2015-06-14 – 2015-06-15 (×3): 10 mg via ORAL
  Administered 2015-06-16 (×2): 5 mg via ORAL
  Filled 2015-06-14: qty 2
  Filled 2015-06-14 (×2): qty 1
  Filled 2015-06-14 (×2): qty 2
  Filled 2015-06-14: qty 1

## 2015-06-14 MED ORDER — METOCLOPRAMIDE HCL 5 MG PO TABS: 5.0000 mg | ORAL_TABLET | Freq: Three times a day (TID) | ORAL | Status: DC | PRN

## 2015-06-14 MED ORDER — TAMOXIFEN CITRATE 10 MG PO TABS
20.0000 mg | ORAL_TABLET | Freq: Every day | ORAL | Status: DC
  Administered 2015-06-15: 20 mg via ORAL
  Filled 2015-06-14 (×3): qty 2

## 2015-06-14 MED ORDER — SODIUM CHLORIDE 0.9 % IV SOLN
10.0000 mg | INTRAVENOUS | Status: DC | PRN
  Administered 2015-06-14: 50 ug/min via INTRAVENOUS

## 2015-06-14 MED ORDER — MIDAZOLAM HCL 5 MG/5ML IJ SOLN
INTRAMUSCULAR | Status: DC | PRN
  Administered 2015-06-14: 2 mg via INTRAVENOUS

## 2015-06-14 MED ORDER — ONDANSETRON HCL 4 MG/2ML IJ SOLN: 4.0000 mg | Freq: Four times a day (QID) | INTRAMUSCULAR | Status: DC | PRN

## 2015-06-14 SURGICAL SUPPLY — 36 items
BAG DECANTER FOR FLEXI CONT (MISCELLANEOUS) IMPLANT
BAG ZIPLOCK 12X15 (MISCELLANEOUS) IMPLANT
BLADE SAG 18X100X1.27 (BLADE) ×3 IMPLANT
CAPT HIP TOTAL 2 ×3 IMPLANT
CLOSURE STERI-STRIP 1/4X4 (GAUZE/BANDAGES/DRESSINGS) ×3 IMPLANT
CLOSURE WOUND 1/2 X4 (GAUZE/BANDAGES/DRESSINGS) ×1
CLOTH BEACON ORANGE TIMEOUT ST (SAFETY) ×3 IMPLANT
COVER PERINEAL POST (MISCELLANEOUS) ×3 IMPLANT
DECANTER SPIKE VIAL GLASS SM (MISCELLANEOUS) ×3 IMPLANT
DRAPE STERI IOBAN 125X83 (DRAPES) ×3 IMPLANT
DRAPE U-SHAPE 47X51 STRL (DRAPES) ×6 IMPLANT
DRSG ADAPTIC 3X8 NADH LF (GAUZE/BANDAGES/DRESSINGS) ×3 IMPLANT
DRSG MEPILEX BORDER 4X4 (GAUZE/BANDAGES/DRESSINGS) ×3 IMPLANT
DRSG MEPILEX BORDER 4X8 (GAUZE/BANDAGES/DRESSINGS) ×3 IMPLANT
DRSG TEGADERM 6X8 (GAUZE/BANDAGES/DRESSINGS) ×6 IMPLANT
DURAPREP 26ML APPLICATOR (WOUND CARE) ×3 IMPLANT
ELECT REM PT RETURN 9FT ADLT (ELECTROSURGICAL) ×3
ELECTRODE REM PT RTRN 9FT ADLT (ELECTROSURGICAL) ×1 IMPLANT
EVACUATOR 1/8 PVC DRAIN (DRAIN) ×3 IMPLANT
GLOVE BIO SURGEON STRL SZ7.5 (GLOVE) ×3 IMPLANT
GLOVE BIO SURGEON STRL SZ8 (GLOVE) ×6 IMPLANT
GLOVE BIOGEL PI IND STRL 8 (GLOVE) ×2 IMPLANT
GLOVE BIOGEL PI INDICATOR 8 (GLOVE) ×4
GOWN STRL REUS W/TWL LRG LVL3 (GOWN DISPOSABLE) ×3 IMPLANT
GOWN STRL REUS W/TWL XL LVL3 (GOWN DISPOSABLE) ×3 IMPLANT
PACK ANTERIOR HIP CUSTOM (KITS) ×3 IMPLANT
STRIP CLOSURE SKIN 1/2X4 (GAUZE/BANDAGES/DRESSINGS) ×2 IMPLANT
SUT ETHIBOND NAB CT1 #1 30IN (SUTURE) ×3 IMPLANT
SUT MNCRL AB 4-0 PS2 18 (SUTURE) ×3 IMPLANT
SUT VIC AB 2-0 CT1 27 (SUTURE) ×4
SUT VIC AB 2-0 CT1 TAPERPNT 27 (SUTURE) ×2 IMPLANT
SUT VLOC 180 0 24IN GS25 (SUTURE) ×3 IMPLANT
SYR 50ML LL SCALE MARK (SYRINGE) IMPLANT
TRAY FOLEY W/METER SILVER 14FR (SET/KITS/TRAYS/PACK) ×3 IMPLANT
TRAY FOLEY W/METER SILVER 16FR (SET/KITS/TRAYS/PACK) IMPLANT
YANKAUER SUCT BULB TIP 10FT TU (MISCELLANEOUS) ×3 IMPLANT

## 2015-06-14 NOTE — Anesthesia Preprocedure Evaluation (Addendum)
Anesthesia Evaluation  Patient identified by MRN, date of birth, ID band Patient awake    Reviewed: Allergy & Precautions, H&P , NPO status , Patient's Chart, lab work & pertinent test results  History of Anesthesia Complications (+) PONV  Airway Mallampati: II  TM Distance: >3 FB Neck ROM: full    Dental  (+) Dental Advisory Given, Chipped 2 front upper teeth are chipped:   Pulmonary neg pulmonary ROS,    Pulmonary exam normal breath sounds clear to auscultation       Cardiovascular Exercise Tolerance: Good + CAD  Normal cardiovascular exam Rhythm:regular Rate:Normal  RBBB. Normal recent stress test.   Neuro/Psych negative neurological ROS  negative psych ROS   GI/Hepatic negative GI ROS, (+) Hepatitis -, A  Endo/Other  negative endocrine ROS  Renal/GU negative Renal ROS  negative genitourinary   Musculoskeletal  (+) Arthritis , Osteoarthritis,    Abdominal   Peds  Hematology negative hematology ROS (+)   Anesthesia Other Findings Malaria  Reproductive/Obstetrics negative OB ROS                          Anesthesia Physical Anesthesia Plan  ASA: II  Anesthesia Plan: Spinal   Post-op Pain Management:    Induction:   Airway Management Planned:   Additional Equipment:   Intra-op Plan:   Post-operative Plan:   Informed Consent: I have reviewed the patients History and Physical, chart, labs and discussed the procedure including the risks, benefits and alternatives for the proposed anesthesia with the patient or authorized representative who has indicated his/her understanding and acceptance.   Dental Advisory Given  Plan Discussed with: CRNA and Surgeon  Anesthesia Plan Comments:        Anesthesia Quick Evaluation

## 2015-06-14 NOTE — Transfer of Care (Signed)
Immediate Anesthesia Transfer of Care Note  Patient: Anne Obrien  Procedure(s) Performed: Procedure(s): RIGHT TOTAL HIP ARTHROPLASTY ANTERIOR APPROACH (Right)  Patient Location: PACU  Anesthesia Type:Spinal  Level of Consciousness: sedated  Airway & Oxygen Therapy: Patient Spontanous Breathing and Patient connected to face mask oxygen  Post-op Assessment: Report given to RN and Post -op Vital signs reviewed and stable  Post vital signs: Reviewed and stable  Last Vitals: There were no vitals filed for this visit.  Complications: No apparent anesthesia complications

## 2015-06-14 NOTE — Anesthesia Postprocedure Evaluation (Deleted)
Anesthesia Post Note  Patient: Anne Obrien  Procedure(s) Performed: Procedure(s) (LRB): RIGHT TOTAL HIP ARTHROPLASTY ANTERIOR APPROACH (Right)  Patient location during evaluation: PACU Anesthesia Type: Spinal Level of consciousness: oriented and awake and alert Pain management: pain level controlled Vital Signs Assessment: post-procedure vital signs reviewed and stable Respiratory status: spontaneous breathing, respiratory function stable and patient connected to nasal cannula oxygen Cardiovascular status: blood pressure returned to baseline and stable Postop Assessment: no headache, no backache, spinal receding, patient able to bend at knees, no signs of nausea or vomiting and adequate PO intake Anesthetic complications: no    Last Vitals:  Filed Vitals:   06/14/15 1600 06/14/15 1615  BP: 111/56   Pulse: 63 63  Temp:    Resp: 9 13    Last Pain:  Filed Vitals:   06/14/15 1615  PainSc: 0-No pain                 Catalina Gravel

## 2015-06-14 NOTE — Anesthesia Postprocedure Evaluation (Signed)
Anesthesia Post Note  Patient: Anne Obrien  Procedure(s) Performed: Procedure(s) (LRB): RIGHT TOTAL HIP ARTHROPLASTY ANTERIOR APPROACH (Right)  Patient location during evaluation: PACU Anesthesia Type: Spinal Level of consciousness: oriented and awake and alert Pain management: pain level controlled Vital Signs Assessment: post-procedure vital signs reviewed and stable Respiratory status: spontaneous breathing, respiratory function stable and patient connected to nasal cannula oxygen Cardiovascular status: blood pressure returned to baseline and stable Postop Assessment: no headache, no backache, spinal receding, patient able to bend at knees, no signs of nausea or vomiting and adequate PO intake Anesthetic complications: no    Last Vitals:  Filed Vitals:   06/14/15 1800 06/14/15 1844  BP: 133/63 123/53  Pulse: 75 77  Temp: 36.8 C 36.9 C  Resp: 18 18    Last Pain:  Filed Vitals:   06/14/15 1924  PainSc: [name]

## 2015-06-14 NOTE — Anesthesia Procedure Notes (Signed)
Spinal Patient location during procedure: OR Start time: 06/14/2015 1:29 PM End time: 06/14/2015 1:31 PM Staffing Resident/CRNA: Harle Stanford R Performed by: resident/CRNA  Preanesthetic Checklist Completed: patient identified, site marked, surgical consent, pre-op evaluation, timeout performed, IV checked, risks and benefits discussed and monitors and equipment checked Spinal Block Patient position: sitting Prep: Betadine Patient monitoring: heart rate, cardiac monitor, continuous pulse ox and blood pressure Approach: midline Location: L3-4 Injection technique: single-shot Needle Needle type: Sprotte  Needle length: 10 cm Needle insertion depth: 7 cm Assessment Sensory level: T6 Additional Notes Timeout performed. Spinal kit date checked. SAB without difficulty

## 2015-06-14 NOTE — Interval H&P Note (Signed)
History and Physical Interval Note:  06/14/2015 1:24 PM  Anne Obrien  has presented today for surgery, with the diagnosis of right hip osteoarthritis  The various methods of treatment have been discussed with the patient and family. After consideration of risks, benefits and other options for treatment, the patient has consented to  Procedure(s): RIGHT TOTAL HIP ARTHROPLASTY ANTERIOR APPROACH (Right) as a surgical intervention .  The patient's history has been reviewed, patient examined, no change in status, stable for surgery.  I have reviewed the patient's chart and labs.  Questions were answered to the patient's satisfaction.     Gearlean Alf

## 2015-06-14 NOTE — Op Note (Signed)
OPERATIVE REPORT  PREOPERATIVE DIAGNOSIS: Osteoarthritis of the Right hip.   POSTOPERATIVE DIAGNOSIS: Osteoarthritis of the Right  hip.   PROCEDURE: Right total hip arthroplasty, anterior approach.   SURGEON: Gaynelle Arabian, MD   ASSISTANT: [name], PA-C  ANESTHESIA:  Spinal  ESTIMATED BLOOD LOSS:-100 ml    DRAINS: Hemovac x1.   COMPLICATIONS: None   CONDITION: PACU - hemodynamically stable.   BRIEF CLINICAL NOTE: Anne Obrien is a 75 y.o. female who has advanced end-  stage arthritis of her Right  hip with progressively worsening pain and  dysfunction.The patient has failed nonoperative management and presents for  total hip arthroplasty.   PROCEDURE IN DETAIL: After successful administration of spinal  anesthetic, the traction boots for the [facility] bed were placed on both  feet and the patient was placed onto the [facility] bed, boots placed into the leg  holders. The Right hip was then isolated from the perineum with plastic  drapes and prepped and draped in the usual sterile fashion. ASIS and  greater trochanter were marked and a oblique incision was made, starting  at about 1 cm lateral and 2 cm distal to the ASIS and coursing towards  the anterior cortex of the femur. The skin was cut with a 10 blade  through subcutaneous tissue to the level of the fascia overlying the  tensor fascia lata muscle. The fascia was then incised in line with the  incision at the junction of the anterior third and posterior 2/3rd. The  muscle was teased off the fascia and then the interval between the TFL  and the rectus was developed. The Hohmann retractor was then placed at  the top of the femoral neck over the capsule. The vessels overlying the  capsule were cauterized and the fat on top of the capsule was removed.  A Hohmann retractor was then placed anterior underneath the rectus  femoris to give exposure to the entire anterior capsule. A T-shaped  capsulotomy was  performed. The edges were tagged and the femoral head  was identified.       Osteophytes are removed off the superior acetabulum.  The femoral neck was then cut in situ with an oscillating saw. Traction  was then applied to the left lower extremity utilizing the [facility]  traction. The femoral head was then removed. Retractors were placed  around the acetabulum and then circumferential removal of the labrum was  performed. Osteophytes were also removed. Reaming starts at 43 mm to  medialize and  Increased in 2 mm increments to 47 mm. We reamed in  approximately 40 degrees of abduction, 20 degrees anteversion. A 48 mm  pinnacle acetabular shell was then impacted in anatomic position under  fluoroscopic guidance with excellent purchase. We did not need to place  any additional dome screws. A 28 mm neutral + 4 marathon liner was then  placed into the acetabular shell.       The femoral lift was then placed along the lateral aspect of the femur  just distal to the vastus ridge. The leg was  externally rotated and capsule  was stripped off the inferior aspect of the femoral neck down to the  level of the lesser trochanter, this was done with electrocautery. The femur was lifted after this was performed. The  leg was then placed and extended in adducted position to essentially delivering the femur. We also removed the capsule superiorly and the  piriformis from the  piriformis fossa to gain excellent exposure of the  proximal femur. Rongeur was used to remove some cancellous bone to get  into the lateral portion of the proximal femur for placement of the  initial starter reamer. The starter broaches was placed  the starter broach  and was shown to go down the center of the canal. Broaching  with the  Corail system was then performed starting at size 8, coursing  Up to size 11. A size 11 had excellent torsional and rotational  and axial stability. The trial standard offset neck was then placed  with a  28 + 1.5 trial head. The hip was then reduced. We confirmed that  the stem was in the canal both on AP and lateral x-rays. It also has excellent sizing. The hip was reduced with outstanding stability through full extension, full external rotation,  and then flexion in adduction internal rotation. AP pelvis was taken  and the leg lengths were measured and found to be exactly equal. Hip  was then dislocated again and the femoral head and neck removed. The  femoral broach was removed. Size 11 Corail stem with a standard offset  neck was then impacted into the femur following native anteversion. Has  excellent purchase in the canal. Excellent torsional and rotational and  axial stability. It is confirmed to be in the canal on AP and lateral  fluoroscopic views. The 28 + 1.5 ceramic head was placed and the hip  reduced with outstanding stability. Again AP pelvis was taken and it  confirmed that the leg lengths were equal. The wound was then copiously  irrigated with saline solution and the capsule reattached and repaired  with Ethibond suture. 30 ml of .25% Bupivicaine injected into the capsule and into the edge of the tensor fascia lata as well as subcutaneous tissue. The fascia overlying the tensor fascia lata was  then closed with a running #1 V-Loc. Subcu was closed with interrupted  2-0 Vicryl and subcuticular running 4-0 Monocryl. Incision was cleaned  and dried. Steri-Strips and a bulky sterile dressing applied. Hemovac  drain was hooked to suction and then he was awakened and transported to  recovery in stable condition.        Please note that a surgical assistant was a medical necessity for this procedure to perform it in a safe and expeditious manner. Assistant was necessary to provide appropriate retraction of vital neurovascular structures and to prevent femoral fracture and allow for anatomic placement of the prosthesis.  Gaynelle Arabian, M.D.

## 2015-06-14 NOTE — Progress Notes (Signed)
Utilization review completed.  

## 2015-06-14 NOTE — H&P (View-Only) (Signed)
Anne Obrien DOB: [birth year] Married / Language: English / Race: White Female Date of Admission:  06/14/2015 CC: Right Hip Pain History of Present Illness The patient is a 75 year old female who comes in  for a preoperative History and Physical. The patient is scheduled for a right total hip arthroplasty (anterior) to be performed by Dr. Dione Plover. Aluisio, MD at [facility] on 06-14-2015. The patient is a 75 year old female who presents with a hip problem. The patient is here today for a second opinion.The patient reports right hip problems including pain, stiffness and difficulty getting up from a seated position symptoms that have been present for 9 month(s). The symptoms began without any known injury. The patient reports symptoms radiating to the: right groin. Current treatment includes nonsteroidal anti-inflammatory drugs (Aleve). Prior to being seen today the patient was previously evaluated by a colleague. Previous workup for this problem has included hip x-rays (ordered by Dr. Drema Dallas; done on 07/20/14; pulled up from the [facility] system) and spinal MRI (ordered by Dr. Ellene Route). Previous treatment for this problem has included corticosteroid injection (intra-articular injection on June 27,2016. She reports signifant relief with injection; but states the pain has started to come back) and physical therapy. Her right hip has gotten progressively worse over at a nine to ten months time span. She has not had any injury leading to this pain. She developed acute pain in her right groin radiating into her thigh. She saw her physical therapist and was doing some therapy for her hip and it was getting a little better. She was subsequently told that her problem was coming from her back and she got sent to Dr. Ellene Route. She had a back injection, but he felt that the problem potentially is somewhere from her hip. She had intra-articular injection in June and it did help until recently. Pain has come back. It  is starting to get worse, but still not as bad as it was prior to the injection. It is limiting what she can and cannot do. She does not have any left hip pain. She is not currently having any knee problems. She does have chronic back pain, but it is non-radicular in nature. She states that the hip has become increasing problematic and she would like to get back to her activities. She is ready to get the hip fixed. They have been treated conservatively in the past for the above stated problem and despite conservative measures, they continue to have progressive pain and severe functional limitations and dysfunction. They have failed non-operative management including home exercise, medications, and injections. It is felt that they would benefit from undergoing total joint replacement. Risks and benefits of the procedure have been discussed with the patient and they elect to proceed with surgery. There are no active contraindications to surgery such as ongoing infection or rapidly progressive neurological disease.  Problem List/Past Medical Primary osteoarthritis of right hip (M16.11)  Breast Cancer  Chronic Cystitis  Chronic Pain  Coronary artery disease  Gastroesophageal Reflux Disease  Hepatitis A  Osteoarthritis  Osteoporosis  Hemorrhoids  Varicose veins  Degenerative Disc Disease  Menopause  Measles  Mumps  Bundle Branch Block  Incomplete Malaria  Allergies  No Known Drug Allergies   Family History  Cancer  mother and grandmother mothers side Cerebrovascular Accident  brother Hypertension  mother and brother Osteoarthritis  mother Osteoporosis  mother Rheumatoid Arthritis  father  Social History Alcohol use  never consumed alcohol Children  5  or more Current work status  retired Engineer, agricultural (Currently)  no Drug/Alcohol Rehab (Previously)  no Exercise  Exercises weekly; does running / walking Illicit drug use  no Living situation  live  with spouse Marital status  married Number of flights of stairs before winded  2-3 Pain Contract  no Tobacco use  never smoker Advance Directives  Living Will  Medication History  Aleve (220MG  Tablet, Oral) Active. Tamoxifen Citrate (20MG  Tablet, Oral) Active. Multivitamin (Oral) Active. Glucosamine (Oral) Specific strength unknown - Active. Calcium Citrate + (Oral) Specific strength unknown - Active. Collagen (Oral) Specific strength unknown - Active. Vitamin D (Oral) Specific strength unknown - Active. Fish Oil (Oral) Specific strength unknown - Active.    Past Surgical History Breast Biopsy  bilateral Breast Mass; Local Excision  left Hysterectomy  partial (non-cancerous)   Review of Systems General Not Present- Chills, Fatigue, Fever, Memory Loss, Night Sweats, Weight Gain and Weight Loss. Skin Not Present- Eczema, Hives, Itching, Lesions and Rash. HEENT Not Present- Dentures, Double Vision, Headache, Hearing Loss, Tinnitus and Visual Loss. Respiratory Not Present- Allergies, Chronic Cough, Coughing up blood, Shortness of breath at rest and Shortness of breath with exertion. Cardiovascular Not Present- Chest Pain, Difficulty Breathing Lying Down, Murmur, Palpitations, Racing/skipping heartbeats and Swelling. Gastrointestinal Not Present- Abdominal Pain, Bloody Stool, Constipation, Diarrhea, Difficulty Swallowing, Heartburn, Jaundice, Loss of appetitie, Nausea and Vomiting. Female Genitourinary Present- Urinating at Night. Not Present- Blood in Urine, Discharge, Flank Pain, Incontinence, Painful Urination, Urgency, Urinary frequency, Urinary Retention and Weak urinary stream. Musculoskeletal Present- Back Pain, Joint Pain, Morning Stiffness and Muscle Pain. Not Present- Joint Swelling, Muscle Weakness and Spasms. Neurological Not Present- Blackout spells, Difficulty with balance, Dizziness, Paralysis, Tremor and Weakness. Psychiatric Not Present-  Insomnia.  Vitals Weight: 150 lb Height: 62in Body Surface Area: 1.69 m Body Mass Index: 27.44 kg/m  Pulse: 76 (Regular)  Resp.: 14 (Unlabored)  BP: 142/82 (Sitting, Right Arm, Standard)  Physical Exam General Mental Status -Alert, cooperative and good historian. General Appearance-pleasant, Not in acute distress. Orientation-Oriented X3. Build & Nutrition-Well nourished and Well developed.  Head and Neck Head-normocephalic, atraumatic . Neck Global Assessment - supple, no bruit auscultated on the right, no bruit auscultated on the left.  Eye Vision-Wears corrective lenses. Pupil - Bilateral-Regular and Round. Motion - Bilateral-EOMI.  Chest and Lung Exam Auscultation Breath sounds - clear at anterior chest wall and clear at posterior chest wall. Adventitious sounds - No Adventitious sounds.  Cardiovascular Auscultation Rhythm - Regular rate and rhythm. Heart Sounds - S1 WNL and S2 WNL. Murmurs & Other Heart Sounds - Auscultation of the heart reveals - No Murmurs.  Abdomen Palpation/Percussion Tenderness - Abdomen is non-tender to palpation. Rigidity (guarding) - Abdomen is soft. Auscultation Auscultation of the abdomen reveals - Bowel sounds normal.  Female Genitourinary Note: Not done, not pertinent to present illness   Musculoskeletal Note: On exam, very pleasant well-developed female, alert and oriented, in no apparent distress. Her left hip shows normal range of motion, no discomfort. Right hip shows flexion to 110, no internal rotation, about 10 to 20 degrees of external rotation, about 40 degrees of abduction. Her right knee exam shows some crepitus on range of motion, ranged about 0 to 135. No medial or lateral tenderness or instability. Pulse sensation and motor intact both lower extremities. Slightly antalgic gait on the right.  RADIOGRAPHS Reviewed her radiographs in April of this year and she had moderate joint space narrowing  in the right hip at that time.  I repeated the AP pelvis and lateral right hip today and she is now bone-on-bone with a subchondral cyst in the acetabulum.  Assessment & Plan Primary osteoarthritis of right hip (M16.11)  Note:Surgical Plans: Right Total Hip Replacement - Anterior Approach  Disposition: Home with daughters  PCP: Dr. Leighton Ruff - Patient has been seen preoperatively and felt to be stable for surgery Cards: Dr. Radford Pax - Patient has been seen preoperatively and felt to be stable for surgery. She had a recent stress test.  Topical TXA - Breast Cancer  Anesthesia Issues: Nausea  AVOID LEFT ARM FOR BP'S AND IV'S  Signed electronically by Ok Edwards, III PA-C

## 2015-06-15 LAB — CBC
HCT: 32.7 % — ABNORMAL LOW (ref 36.0–46.0)
HEMOGLOBIN: 10.8 g/dL — AB (ref 12.0–15.0)
MCH: 30.4 pg (ref 26.0–34.0)
MCHC: 33 g/dL (ref 30.0–36.0)
MCV: 92.1 fL (ref 78.0–100.0)
Platelets: 164 10*3/uL (ref 150–400)
RBC: 3.55 MIL/uL — AB (ref 3.87–5.11)
RDW: 12.5 % (ref 11.5–15.5)
WBC: 13.4 10*3/uL — ABNORMAL HIGH (ref 4.0–10.5)

## 2015-06-15 LAB — BASIC METABOLIC PANEL
Anion gap: 7 (ref 5–15)
BUN: 13 mg/dL (ref 6–20)
CHLORIDE: 106 mmol/L (ref 101–111)
CO2: 23 mmol/L (ref 22–32)
CREATININE: 0.5 mg/dL (ref 0.44–1.00)
Calcium: 8.1 mg/dL — ABNORMAL LOW (ref 8.9–10.3)
GFR calc Af Amer: >60 mL/min (ref >60)
GFR calc non Af Amer: >60 mL/min (ref >60)
GLUCOSE: 155 mg/dL — AB (ref 65–99)
POTASSIUM: 4 mmol/L (ref 3.5–5.1)
SODIUM: 136 mmol/L (ref 135–145)

## 2015-06-15 NOTE — Care Management Note (Signed)
Case Management Note  Patient Details  Name: DASHAI BIESE MRN: [id number] Date of Birth: 05-13-1940  Subjective/Objective:                  RIGHT TOTAL HIP ARTHROPLASTY ANTERIOR APPROACH (Right) Action/Plan: discharge planning Expected Discharge Date:                  Expected Discharge Plan:  [place]  In-House Referral:     Discharge planning Services  CM Consult  Post Acute Care Choice:  Home Health Choice offered to:  Patient  DME Arranged:  N/A DME Agency:  NA  HH Arranged:  PT HH Agency:  [facility]  Status of Service:  Completed, signed off  Medicare Important Message Given:    Date Medicare IM Given:    Medicare IM give by:    Date Additional Medicare IM Given:    Additional Medicare Important Message give by:     If discussed at [place] of Stay Meetings, dates discussed:    Additional Comments: Pt pre-surgically set up with [facility] for HHPT.  [facility] Go rep, Tim on unit and confirms.  Pt confirms.  No DME needed as pt has DME at home.  No other CM needs were communicated. Dellie Catholic, RN 06/15/2015, 3:19 PM

## 2015-06-15 NOTE — Evaluation (Signed)
Occupational Therapy Evaluation Patient Details Name: Anne Obrien MRN: [id number] DOB: 08/08/1940 Today's Date: 06/15/2015    History of Present Illness s/p R DA THA   Clinical Impression   This 75 year old female was admitted for the above surgery. All education was completed.  No further OT is needed at this time    Follow Up Recommendations  No OT follow up;Supervision/Assistance - 24 hour    Equipment Recommendations  None recommended by OT    Recommendations for Other Services       Precautions / Restrictions Precautions Precautions: Fall Restrictions Weight Bearing Restrictions: No      Mobility Bed Mobility Overal bed mobility: Modified Independent (sit to supine)             General bed mobility comments: crossed LLE under R to support back to bed  Transfers Overall transfer level: Needs assistance Equipment used: Rolling walker (2 wheeled) Transfers: Sit to/from Stand Sit to Stand: Supervision         General transfer comment: cues for UE placement    Balance                                            ADL Overall ADL's : Needs assistance/impaired             Lower Body Bathing: Minimal assistance;Sit to/from stand       Lower Body Dressing: Moderate assistance;Sit to/from stand   Toilet Transfer: Min guard;Ambulation       Tub/ Shower Transfer: Walk-in shower;Min guard;Ambulation     General ADL Comments: pt has built in seat in shower.  Educated not to overdo it.  Showed her sock aide. She has a Secondary school teacher and daughter will get her a long netted sponge on a stick.  Pt feels she will sleep on couch so husband doesn't disturb her sleep.  Pt initially said she would wear flip flops.  Educated that she should wear closed back shoes and that she will wear ted hose also     Vision     Perception     Praxis      Pertinent Vitals/Pain Pain Assessment: Faces Faces Pain Scale: Hurts little more Pain  Location: R hip Pain Descriptors / Indicators: Aching Pain Intervention(s): Limited activity within patient's tolerance;Monitored during session;Patient requesting pain meds-RN notified;Repositioned;Ice applied     Hand Dominance     Extremity/Trunk Assessment Upper Extremity Assessment Upper Extremity Assessment: Overall WFL for tasks assessed           Communication Communication Communication: No difficulties   Cognition Arousal/Alertness: Awake/alert Behavior During Therapy: WFL for tasks assessed/performed Overall Cognitive Status: Within Functional Limits for tasks assessed                     General Comments       Exercises       Shoulder Instructions      Home Living Family/patient expects to be discharged to:: Private residence Living Arrangements: Spouse/significant other Available Help at Discharge: Family               Bathroom Shower/Tub: Walk-in Psychologist, prison and probation services: Standard     Home Equipment: Bedside commode   Additional Comments: 4 daughters will help.  Pt will likely stay on couch for a few nights       Prior Functioning/Environment Level  of Independence: Independent             OT Diagnosis: Acute pain   OT Problem List:     OT Treatment/Interventions:      OT Goals(Current goals can be found in the care plan section) Acute Rehab OT Goals Patient Stated Goal: get back to being independent OT Goal Formulation: All assessment and education complete, DC therapy  OT Frequency:     Barriers to D/C:            Co-evaluation              End of Session    Activity Tolerance: Patient tolerated treatment well Patient left: in bed;with call bell/phone within reach (bed alarm wouldn't set:  notified NT)   Time: [id number] OT Time Calculation (min): 16 min Charges:  OT General Charges $OT Visit: 1 Procedure OT Evaluation $OT Eval Low Complexity: 1 Procedure G-Codes:    Judieth Mckown 06-18-2015,  10:02 AM  Lesle Chris, OTR/L [phone] 06/18/2015

## 2015-06-15 NOTE — Evaluation (Addendum)
Physical Therapy Evaluation Patient Details Name: Anne Obrien MRN: [id number] DOB: 1941/01/07 Today's Date: 06/15/2015   History of Present Illness  s/p R DA THA  Clinical Impression  Pt is s/p THA resulting in the deficits listed below (see PT Problem List). Pt ambulated 130' with RW with supervision and performed THA exercises with min A. Excellent progress expected.  Pt will benefit from skilled PT to increase their independence and safety with mobility to allow discharge to the venue listed below.      Follow Up Recommendations Home health PT    Equipment Recommendations  Rolling walker with 5" wheels    Recommendations for Other Services OT consult     Precautions / Restrictions Precautions Precautions: Fall Restrictions Weight Bearing Restrictions: No      Mobility  Bed Mobility Overal bed mobility: Modified Independent (sit to supine)             General bed mobility comments: crossed LLE under R for supine to sit  Transfers Overall transfer level: Needs assistance Equipment used: Rolling walker (2 wheeled) Transfers: Sit to/from Stand Sit to Stand: Supervision         General transfer comment: cues for UE placement  Ambulation/Gait Ambulation/Gait assistance: Supervision Ambulation Distance (Feet): 130 Feet Assistive device: Rolling walker (2 wheeled) Gait Pattern/deviations: Step-to pattern   Gait velocity interpretation: Below normal speed for age/gender General Gait Details: steady with RW, good sequencing  Stairs            Wheelchair Mobility    Modified Rankin (Stroke Patients Only)       Balance Overall balance assessment: Modified Independent                                           Pertinent Vitals/Pain Pain Assessment: 0-10 Pain Score: 4  Faces Pain Scale: Hurts little more Pain Location: R hip with walking Pain Descriptors / Indicators: Aching Pain Intervention(s): Limited activity within  patient's tolerance;Monitored during session;Premedicated before session;Ice applied    Home Living Family/patient expects to be discharged to:: Private residence Living Arrangements: Spouse/significant other Available Help at Discharge: Family   Home Access: Stairs to enter   Entrance Stairs-Number of Steps: 3   Home Equipment: Bedside commode, 4 wheeled RW Additional Comments: 4 daughters will help.  Pt will likely stay on couch for a few nights     Prior Function Level of Independence: Independent               Hand Dominance        Extremity/Trunk Assessment   Upper Extremity Assessment: Overall WFL for tasks assessed           Lower Extremity Assessment: RLE deficits/detail RLE Deficits / Details: knee ext +3/5, ankle WNL, hip 3/5, sensation intact to light touch       Communication   Communication: No difficulties  Cognition Arousal/Alertness: Awake/alert Behavior During Therapy: WFL for tasks assessed/performed Overall Cognitive Status: Within Functional Limits for tasks assessed                      General Comments      Exercises Total Joint Exercises Ankle Circles/Pumps: AROM;Both;10 reps;Supine Quad Sets: AROM;Both;10 reps;Supine Short Arc Quad: AROM;Right;10 reps;Supine Heel Slides: AAROM;Right;10 reps;Supine Hip ABduction/ADduction: AAROM;Right;10 reps;Supine Long Arc Quad: AROM;Right;10 reps;Seated      Assessment/Plan    PT  Assessment Patient needs continued PT services  PT Diagnosis Difficulty walking;Acute pain   PT Problem List Decreased strength;Decreased activity tolerance;Pain;Decreased knowledge of use of DME;Decreased mobility  PT Treatment Interventions DME instruction;Gait training;Stair training;Functional mobility training;Therapeutic activities;Patient/family education;Therapeutic exercise   PT Goals (Current goals can be found in the Care Plan section) Acute Rehab PT Goals Patient Stated Goal: gardening PT  Goal Formulation: With patient/family Time For Goal Achievement: 06/22/15 Potential to Achieve Goals: Good    Frequency 7X/week   Barriers to discharge        Co-evaluation               End of Session Equipment Utilized During Treatment: Gait belt Activity Tolerance: Patient tolerated treatment well Patient left: in chair;with call bell/phone within reach;with chair alarm set Nurse Communication: Mobility status         Time: [id number] PT Time Calculation (min) (ACUTE ONLY): 25 min   Charges:   PT Evaluation $PT Eval Low Complexity: 1 Procedure PT Treatments $Gait Training: 8-22 mins   PT G Codes:        Philomena Doheny 06/15/2015, 10:35 AM [phone]

## 2015-06-15 NOTE — Progress Notes (Signed)
Physical Therapy Treatment Patient Details Name: Anne Obrien MRN: [id number] DOB: 02-10-1941 Today's Date: 06/15/2015    History of Present Illness s/p R DA THA    PT Comments    Pt is progressing well with mobility, she ambulated 180' with RW. Will plan to do stair training tomorrow morning.   Follow Up Recommendations  Home health PT     Equipment Recommendations  None recommended by PT    Recommendations for Other Services OT consult     Precautions / Restrictions Precautions Precautions: Fall Restrictions Weight Bearing Restrictions: No Other Position/Activity Restrictions: WBAT    Mobility  Bed Mobility Overal bed mobility: Modified Independent (sit to supine)             General bed mobility comments: crossed LLE under R for supine to sit  Transfers Overall transfer level: Needs assistance Equipment used: Rolling walker (2 wheeled) Transfers: Sit to/from Stand Sit to Stand: Supervision         General transfer comment: cues for UE placement  Ambulation/Gait Ambulation/Gait assistance: Supervision Ambulation Distance (Feet): 180 Feet Assistive device: Rolling walker (2 wheeled) Gait Pattern/deviations: Step-to pattern   Gait velocity interpretation: Below normal speed for age/gender General Gait Details: steady with RW, good sequencing   Stairs            Wheelchair Mobility    Modified Rankin (Stroke Patients Only)       Balance Overall balance assessment: Modified Independent                                  Cognition Arousal/Alertness: Awake/alert Behavior During Therapy: WFL for tasks assessed/performed Overall Cognitive Status: Within Functional Limits for tasks assessed                      Exercises Total Joint Exercises Ankle Circles/Pumps: AROM;Both;10 reps Heel Slides: AAROM;Right;10 reps;Supine Hip ABduction/ADduction: AAROM;Right;10 reps;Supine    General Comments         Pertinent Vitals/Pain Pain Score: 3  Pain Location: R hip with walking Pain Descriptors / Indicators: Sore Pain Intervention(s): Monitored during session;RN gave pain meds during session;Limited activity within patient's tolerance;Ice applied    Home Living                      Prior Function            PT Goals (current goals can now be found in the care plan section) Acute Rehab PT Goals Patient Stated Goal: gardening PT Goal Formulation: With patient/family Time For Goal Achievement: 06/22/15 Potential to Achieve Goals: Good Progress towards PT goals: Progressing toward goals    Frequency  7X/week    PT Plan Current plan remains appropriate    Co-evaluation             End of Session Equipment Utilized During Treatment: Gait belt Activity Tolerance: Patient tolerated treatment well Patient left: with call bell/phone within reach;in bed;with bed alarm set     Time: 1423-1443 PT Time Calculation (min) (ACUTE ONLY): 20 min  Charges:  $Gait Training: 8-22 mins                    G Codes:      Philomena Doheny 06/15/2015, 2:49 PM [phone]

## 2015-06-15 NOTE — Discharge Instructions (Addendum)
° °Dr. Frank Aluisio °Total Joint Specialist °[facility] °[address] °[place], [place] [address] °[phone] ° °ANTERIOR APPROACH TOTAL HIP REPLACEMENT POSTOPERATIVE DIRECTIONS ° ° °Hip Rehabilitation, Guidelines Following Surgery  °The results of a hip operation are greatly improved after range of motion and muscle strengthening exercises. Follow all safety measures which are given to protect your hip. If any of these exercises cause increased pain or swelling in your joint, decrease the amount until you are comfortable again. Then slowly increase the exercises. Call your caregiver if you have problems or questions.  ° °HOME CARE INSTRUCTIONS  °Remove items at home which could result in a fall. This includes throw rugs or furniture in walking pathways.  °· ICE to the affected hip every three hours for 30 minutes at a time and then as needed for pain and swelling.  Continue to use ice on the hip for pain and swelling from surgery. You may notice swelling that will progress down to the foot and ankle.  This is normal after surgery.  Elevate the leg when you are not up walking on it.   °· Continue to use the breathing machine which will help keep your temperature down.  It is common for your temperature to cycle up and down following surgery, especially at night when you are not up moving around and exerting yourself.  The breathing machine keeps your lungs expanded and your temperature down. ° ° °DIET °You may resume your previous home diet once your are discharged from the hospital. ° °DRESSING / WOUND CARE / SHOWERING °You may shower 3 days after surgery, but keep the wounds dry during showering.  You may use an occlusive plastic wrap (Press'n Seal for example), NO SOAKING/SUBMERGING IN THE BATHTUB.  If the bandage gets wet, change with a clean dry gauze.  If the incision gets wet, pat the wound dry with a clean towel. °You may start showering once you are discharged home but do not  submerge the incision under water. Just pat the incision dry and apply a dry gauze dressing on daily. °Change the surgical dressing daily and reapply a dry dressing each time. ° °ACTIVITY °Walk with your walker as instructed. °Use walker as long as suggested by your caregivers. °Avoid periods of inactivity such as sitting longer than an hour when not asleep. This helps prevent blood clots.  °You may resume a sexual relationship in one month or when given the OK by your doctor.  °You may return to work once you are cleared by your doctor.  °Do not drive a car for 6 weeks or until released by you surgeon.  °Do not drive while taking narcotics. ° °WEIGHT BEARING °Weight bearing as tolerated with assist device (walker, cane, etc) as directed, use it as long as suggested by your surgeon or therapist, typically at least 4-6 weeks. ° °POSTOPERATIVE CONSTIPATION PROTOCOL °Constipation - defined medically as fewer than three stools per week and severe constipation as less than one stool per week. ° °One of the most common issues patients have following surgery is constipation.  Even if you have a regular bowel pattern at home, your normal regimen is likely to be disrupted due to multiple reasons following surgery.  Combination of anesthesia, postoperative narcotics, change in appetite and fluid intake all can affect your bowels.  In order to avoid complications following surgery, here are some recommendations in order to help you during your recovery period. ° °Colace (docusate) - Pick up an over-the-counter   form of Colace or another stool softener and take twice a day as long as you are requiring postoperative pain medications.  Take with a full glass of water daily.  If you experience loose stools or diarrhea, hold the colace until you stool forms back up.  If your symptoms do not get better within 1 week or if they get worse, check with your doctor. ° °Dulcolax (bisacodyl) - Pick up over-the-counter and take as directed  by the product packaging as needed to assist with the movement of your bowels.  Take with a full glass of water.  Use this product as needed if not relieved by Colace only.  ° °MiraLax (polyethylene glycol) - Pick up over-the-counter to have on hand.  MiraLax is a solution that will increase the amount of water in your bowels to assist with bowel movements.  Take as directed and can mix with a glass of water, juice, soda, coffee, or tea.  Take if you go more than two days without a movement. °Do not use MiraLax more than once per day. Call your doctor if you are still constipated or irregular after using this medication for 7 days in a row. ° °If you continue to have problems with postoperative constipation, please contact the office for further assistance and recommendations.  If you experience "the worst abdominal pain ever" or develop nausea or vomiting, please contact the office immediatly for further recommendations for treatment. ° °ITCHING ° If you experience itching with your medications, try taking only a single pain pill, or even half a pain pill at a time.  You can also use Benadryl over the counter for itching or also to help with sleep.  ° °TED HOSE STOCKINGS °Wear the elastic stockings on both legs for three weeks following surgery during the day but you may remove then at night for sleeping. ° °MEDICATIONS °See your medication summary on the “After Visit Summary” that the nursing staff will review with you prior to discharge.  You may have some home medications which will be placed on hold until you complete the course of blood thinner medication.  It is important for you to complete the blood thinner medication as prescribed by your surgeon.  Continue your approved medications as instructed at time of discharge. ° °PRECAUTIONS °If you experience chest pain or shortness of breath - call 911 immediately for transfer to the hospital emergency department.  °If you develop a fever greater that 101 F,  purulent drainage from wound, increased redness or drainage from wound, foul odor from the wound/dressing, or calf pain - CONTACT YOUR SURGEON.   °                                                °FOLLOW-UP APPOINTMENTS °Make sure you keep all of your appointments after your operation with your surgeon and caregivers. You should call the office at the above phone number and make an appointment for approximately two weeks after the date of your surgery or on the date instructed by your surgeon outlined in the "After Visit Summary". ° °RANGE OF MOTION AND STRENGTHENING EXERCISES  °These exercises are designed to help you keep full movement of your hip joint. Follow your caregiver's or physical therapist's instructions. Perform all exercises about fifteen times, three times per day or as directed. Exercise both hips, even if you   have had only one joint replacement. These exercises can be done on a training (exercise) mat, on the floor, on a table or on a bed. Use whatever works the best and is most comfortable for you. Use music or television while you are exercising so that the exercises are a pleasant break in your day. This will make your life better with the exercises acting as a break in routine you can look forward to.  °Lying on your back, slowly slide your foot toward your buttocks, raising your knee up off the floor. Then slowly slide your foot back down until your leg is straight again.  °Lying on your back spread your legs as far apart as you can without causing discomfort.  °Lying on your side, raise your upper leg and foot straight up from the floor as far as is comfortable. Slowly lower the leg and repeat.  °Lying on your back, tighten up the muscle in the front of your thigh (quadriceps muscles). You can do this by keeping your leg straight and trying to raise your heel off the floor. This helps strengthen the largest muscle supporting your knee.  °Lying on your back, tighten up the muscles of your  buttocks both with the legs straight and with the knee bent at a comfortable angle while keeping your heel on the floor.  ° °IF YOU ARE TRANSFERRED TO A SKILLED REHAB FACILITY °If the patient is transferred to a skilled rehab facility following release from the hospital, a list of the current medications will be sent to the facility for the patient to continue.  When discharged from the skilled rehab facility, please have the facility set up the patient's Home Health Physical Therapy prior to being released. Also, the skilled facility will be responsible for providing the patient with their medications at time of release from the facility to include their pain medication, the muscle relaxants, and their blood thinner medication. If the patient is still at the rehab facility at time of the two week follow up appointment, the skilled rehab facility will also need to assist the patient in arranging follow up appointment in our office and any transportation needs. ° °MAKE SURE YOU:  °Understand these instructions.  °Get help right away if you are not doing well or get worse.  ° ° °Pick up stool softner and laxative for home use following surgery while on pain medications. °Do not submerge incision under water. °Please use good hand washing techniques while changing dressing each day. °May shower starting three days after surgery. °Please use a clean towel to pat the incision dry following showers. °Continue to use ice for pain and swelling after surgery. °Do not use any lotions or creams on the incision until instructed by your surgeon. ° °Take Xarelto for two and a half more weeks, then discontinue Xarelto. °Once the patient has completed the Xarelto, they may resume the 81 mg Aspirin. ° ° ° °Information on my medicine - XARELTO® (Rivaroxaban) ° °This medication education was reviewed with me or my healthcare representative as part of my discharge preparation.  ° °Why was Xarelto® prescribed for you? °Xarelto® was  prescribed for you to reduce the risk of blood clots forming after orthopedic surgery. The medical term for these abnormal blood clots is venous thromboembolism (VTE). ° °What do you need to know about xarelto® ? °Take your Xarelto® ONCE DAILY at the same time every day. °You may take it either with or without food. ° °If you have difficulty   swallowing the tablet whole, you may crush it and mix in applesauce just prior to taking your dose. ° °Take Xarelto® exactly as prescribed by your doctor and DO NOT stop taking Xarelto® without talking to the doctor who prescribed the medication.  Stopping without other VTE prevention medication to take the place of Xarelto® may increase your risk of developing a clot. ° °After discharge, you should have regular check-up appointments with your healthcare provider that is prescribing your Xarelto®.   ° °What do you do if you miss a dose? °If you miss a dose, take it as soon as you remember on the same day then continue your regularly scheduled once daily regimen the next day. Do not take two doses of Xarelto® on the same day.  ° °Important Safety Information °A possible side effect of Xarelto® is bleeding. You should call your healthcare provider right away if you experience any of the following: °? Bleeding from an injury or your nose that does not stop. °? Unusual colored urine (red or dark brown) or unusual colored stools (red or black). °? Unusual bruising for unknown reasons. °? A serious fall or if you hit your head (even if there is no bleeding). ° °Some medicines may interact with Xarelto® and might increase your risk of bleeding while on Xarelto®. To help avoid this, consult your healthcare provider or pharmacist prior to using any new prescription or non-prescription medications, including herbals, vitamins, non-steroidal anti-inflammatory drugs (NSAIDs) and supplements. ° °This website has more information on Xarelto®: www.xarelto.com. ° ° °

## 2015-06-16 LAB — BASIC METABOLIC PANEL
Anion gap: 7 (ref 5–15)
BUN: 13 mg/dL (ref 6–20)
CALCIUM: 9.1 mg/dL (ref 8.9–10.3)
CO2: 28 mmol/L (ref 22–32)
CREATININE: 0.55 mg/dL (ref 0.44–1.00)
Chloride: 109 mmol/L (ref 101–111)
GFR calc Af Amer: >60 mL/min (ref >60)
Glucose, Bld: 135 mg/dL — ABNORMAL HIGH (ref 65–99)
Potassium: 4.7 mmol/L (ref 3.5–5.1)
SODIUM: 144 mmol/L (ref 135–145)

## 2015-06-16 LAB — CBC
HCT: 31.6 % — ABNORMAL LOW (ref 36.0–46.0)
Hemoglobin: 10.4 g/dL — ABNORMAL LOW (ref 12.0–15.0)
MCH: 29.4 pg (ref 26.0–34.0)
MCHC: 32.9 g/dL (ref 30.0–36.0)
MCV: 89.3 fL (ref 78.0–100.0)
PLATELETS: 165 10*3/uL (ref 150–400)
RBC: 3.54 MIL/uL — AB (ref 3.87–5.11)
RDW: 12.6 % (ref 11.5–15.5)
WBC: 19.9 10*3/uL — ABNORMAL HIGH (ref 4.0–10.5)

## 2015-06-16 MED ORDER — OXYCODONE HCL 5 MG PO TABS: 5.0000 mg | ORAL_TABLET | ORAL | Status: DC | PRN

## 2015-06-16 MED ORDER — METHOCARBAMOL 500 MG PO TABS: 500.0000 mg | ORAL_TABLET | Freq: Four times a day (QID) | ORAL | Status: DC | PRN

## 2015-06-16 MED ORDER — TRAMADOL HCL 50 MG PO TABS: 50.0000 mg | ORAL_TABLET | Freq: Four times a day (QID) | ORAL | Status: DC | PRN

## 2015-06-16 MED ORDER — RIVAROXABAN 10 MG PO TABS: 10.0000 mg | ORAL_TABLET | Freq: Every day | ORAL | Status: DC

## 2015-06-16 MED FILL — METHOCARBAMOL 500 MG TABLET: 500 | 20 days supply | Qty: 80 | Fill #0

## 2015-06-16 MED FILL — XARELTO 10 MG TABLET: 10 | 19 days supply | Qty: 19 | Fill #0

## 2015-06-16 MED FILL — traMADol HCL 50 MG TABS: 50 | 10 days supply | Qty: 80 | Fill #0

## 2015-06-16 MED FILL — oxyCODONE HCL 5 MG TABS: 5 | 7 days supply | Qty: 80 | Fill #0

## 2015-06-16 NOTE — Progress Notes (Signed)
Physical Therapy Treatment Patient Details Name: JASBIR OLESEN MRN: [id number] DOB: 12-25-1940 Today's Date: 06/16/2015    History of Present Illness s/p R DA THA    PT Comments    Pt progressing well with mobility and eager for return home.  Reviewed therex, stairs and car transfers with pt and dtr.  Follow Up Recommendations  Home health PT     Equipment Recommendations  None recommended by PT    Recommendations for Other Services OT consult     Precautions / Restrictions Precautions Precautions: Fall Restrictions Weight Bearing Restrictions: No Other Position/Activity Restrictions: WBAT    Mobility  Bed Mobility Overal bed mobility: Modified Independent                Transfers Overall transfer level: Needs assistance Equipment used: Rolling walker (2 wheeled) Transfers: Sit to/from Stand Sit to Stand: Supervision         General transfer comment: cues for UE placement  Ambulation/Gait Ambulation/Gait assistance: Min guard;Supervision Ambulation Distance (Feet): 180 Feet Assistive device: Rolling walker (2 wheeled) Gait Pattern/deviations: Step-to pattern;Step-through pattern;Decreased step length - right;Decreased step length - left;Shuffle;Trunk flexed     General Gait Details: cues for posture and position from RW   Stairs Stairs: Yes Stairs assistance: Min guard Stair Management: Two rails;One rail Right;Step to pattern;Forwards;With cane Number of Stairs: 5 General stair comments: 2 steps with bil rails and 3 steps with cane and rail  Wheelchair Mobility    Modified Rankin (Stroke Patients Only)       Balance                                    Cognition Arousal/Alertness: Awake/alert Behavior During Therapy: WFL for tasks assessed/performed Overall Cognitive Status: Within Functional Limits for tasks assessed                      Exercises Total Joint Exercises Ankle Circles/Pumps: AROM;Both;10  reps Quad Sets: AROM;Both;10 reps;Supine Heel Slides: AAROM;Right;Supine;20 reps Hip ABduction/ADduction: AAROM;Right;Supine;20 reps Long Arc Quad: AROM;Right;10 reps;Seated    General Comments        Pertinent Vitals/Pain Pain Assessment: 0-10 Pain Score: 5  Pain Location: R hip Pain Descriptors / Indicators: Aching;Sore Pain Intervention(s): Limited activity within patient's tolerance;Monitored during session;Premedicated before session;Ice applied    Home Living                      Prior Function            PT Goals (current goals can now be found in the care plan section) Acute Rehab PT Goals Patient Stated Goal: gardening PT Goal Formulation: With patient/family Time For Goal Achievement: 06/22/15 Potential to Achieve Goals: Good Progress towards PT goals: Progressing toward goals    Frequency  7X/week    PT Plan Current plan remains appropriate    Co-evaluation             End of Session Equipment Utilized During Treatment: Gait belt Activity Tolerance: Patient tolerated treatment well Patient left: in chair;with call bell/phone within reach;with family/visitor present     Time: 0928-1006 PT Time Calculation (min) (ACUTE ONLY): 38 min  Charges:  $Gait Training: 8-22 mins $Therapeutic Exercise: 8-22 mins $Therapeutic Activity: 8-22 mins                    G Codes:  Sharlyn Odonnel 06/16/2015, 12:45 PM

## 2015-06-16 NOTE — Progress Notes (Signed)
   Subjective: 2 Days Post-Op Procedure(s) (LRB): RIGHT TOTAL HIP ARTHROPLASTY ANTERIOR APPROACH (Right) Patient reports pain as mild.   Patient seen in rounds with Dr. Wynelle Link. Patient is well, and has had no acute complaints or problems Patient is ready to go home  Objective: Vital signs in last 24 hours: Temp:  [97.7 F (36.5 C)-98.4 F (36.9 C)] 98.4 F (36.9 C) (03/10 0426) Pulse Rate:  [65-73] 72 (03/10 0426) Resp:  [16] 16 (03/10 0426) BP: (121-141)/(47-103) 121/72 mmHg (03/10 0426) SpO2:  [98 %-100 %] 98 % (03/10 0426)  Intake/Output from previous day:  Intake/Output Summary (Last 24 hours) at 06/16/15 0911 Last data filed at 06/16/15 [id number]  Gross per 24 hour  Intake    840 ml  Output   2100 ml  Net  -1260 ml    Intake/Output this shift: Total I/O In: 240 [P.O.:240] Out: -   Labs:  Recent Labs  06/15/15 0342 06/16/15 0339  HGB 10.8* 10.4*    Recent Labs  06/15/15 0342 06/16/15 0339  WBC 13.4* 19.9*  RBC 3.55* 3.54*  HCT 32.7* 31.6*  PLT 164 165    Recent Labs  06/15/15 0342 06/16/15 0339  NA 136 144  K 4.0 4.7  CL 106 109  CO2 23 28  BUN 13 13  CREATININE 0.50 0.55  GLUCOSE 155* 135*  CALCIUM 8.1* 9.1   No results for input(s): LABPT, INR in the last 72 hours.  EXAM: General - Patient is Alert and Appropriate Extremity - Neurovascular intact Sensation intact distally Incision - clean, dry Motor Function - intact, moving foot and toes well on exam.   Assessment/Plan: 2 Days Post-Op Procedure(s) (LRB): RIGHT TOTAL HIP ARTHROPLASTY ANTERIOR APPROACH (Right) Procedure(s) (LRB): RIGHT TOTAL HIP ARTHROPLASTY ANTERIOR APPROACH (Right) Past Medical History  Diagnosis Date  . Bundle branch block   . Hepatitis A 1976  . Cancer [facility])     face  . Breast cancer ([place])     stage I left  . Arthritis   . Malaria   . Osteopenia   . Malaria   . PONV (postoperative nausea and vomiting)   . Chronic cystitis   . Chronic pain   . Coronary  artery disease   . Family history of adverse reaction to anesthesia     pt states her son has to have large amount of anesthesia - difficulty to go under   . GERD (gastroesophageal reflux disease)   . Osteoporosis   . Hemorrhoids   . Varicose veins   . Urinary tract infection   . Menopause   . Bursitis   . Degenerative disc disease, lumbar   . History of measles   . History of mumps   . Nocturia    Principal Problem:   OA (osteoarthritis) of hip  Estimated body mass index is 27.61 kg/(m^2) as calculated from the following:   Height as of this encounter: 5\' 2"  (1.575 m).   Weight as of this encounter: 68.493 kg (151 lb). Up with therapy Discharge home with home health Diet - Regular diet Follow up - in 2 weeks Activity - WBAT Disposition - Home Condition Upon Discharge - Good D/C Meds - See DC Summary DVT Prophylaxis - Xarelto  [name], PA-C Orthopaedic Surgery 06/16/2015, 9:11 AM

## 2015-06-16 NOTE — Progress Notes (Signed)
Pt d/c home with [facility]. No DME needs. Prescriptions given and explained to patient. AVS reviewed and "My Chart" discussed with pt. Pt capable of verbalizing medications, dressing changes, signs and symptoms of infection, and follow-up appointments. Remains hemodynamically stable. No signs and symptoms of distress. Educated pt to return to ER in the case of SOB, dizziness, or chest pain.

## 2015-06-16 NOTE — Discharge Summary (Signed)
Physician Discharge Summary   Patient ID: Anne Obrien MRN: [id number] DOB/AGE: 10/18/1940 75 y.o.  Admit date: 06/14/2015 Discharge date: 06/16/2015  Primary Diagnosis:  Osteoarthritis of the Right hip.   Admission Diagnoses:  Past Medical History  Diagnosis Date  . Bundle branch block   . Hepatitis A 1976  . Cancer [facility])     face  . Breast cancer ([place])     stage I left  . Arthritis   . Malaria   . Osteopenia   . Malaria   . PONV (postoperative nausea and vomiting)   . Chronic cystitis   . Chronic pain   . Coronary artery disease   . Family history of adverse reaction to anesthesia     pt states her son has to have large amount of anesthesia - difficulty to go under   . GERD (gastroesophageal reflux disease)   . Osteoporosis   . Hemorrhoids   . Varicose veins   . Urinary tract infection   . Menopause   . Bursitis   . Degenerative disc disease, lumbar   . History of measles   . History of mumps   . Nocturia    Discharge Diagnoses:   Principal Problem:   OA (osteoarthritis) of hip  Estimated body mass index is 27.61 kg/(m^2) as calculated from the following:   Height as of this encounter: 5' 2"  (1.575 m).   Weight as of this encounter: 68.493 kg (151 lb).  Procedure(s) (LRB): RIGHT TOTAL HIP ARTHROPLASTY ANTERIOR APPROACH (Right)   Consults: None  HPI: Anne Obrien is a 75 y.o. female who has advanced end-  stage arthritis of her Right hip with progressively worsening pain and  dysfunction.The patient has failed nonoperative management and presents for  total hip arthroplasty.   Laboratory Data: Admission on 06/14/2015, Discharged on 06/16/2015  Component Date Value Ref Range Status  . WBC 06/15/2015 13.4* 4.0 - 10.5 K/uL Final  . RBC 06/15/2015 3.55* 3.87 - 5.11 MIL/uL Final  . Hemoglobin 06/15/2015 10.8* 12.0 - 15.0 g/dL Final  . HCT 06/15/2015 32.7* 36.0 - 46.0 % Final  . MCV 06/15/2015 92.1  78.0 - 100.0 fL Final  . MCH 06/15/2015  30.4  26.0 - 34.0 pg Final  . MCHC 06/15/2015 33.0  30.0 - 36.0 g/dL Final  . RDW 06/15/2015 12.5  11.5 - 15.5 % Final  . Platelets 06/15/2015 164  150 - 400 K/uL Final  . Sodium 06/15/2015 136  135 - 145 mmol/L Final  . Potassium 06/15/2015 4.0  3.5 - 5.1 mmol/L Final  . Chloride 06/15/2015 106  101 - 111 mmol/L Final  . CO2 06/15/2015 23  22 - 32 mmol/L Final  . Glucose, Bld 06/15/2015 155* 65 - 99 mg/dL Final  . BUN 06/15/2015 13  6 - 20 mg/dL Final  . Creatinine, Ser 06/15/2015 0.50  0.44 - 1.00 mg/dL Final  . Calcium 06/15/2015 8.1* 8.9 - 10.3 mg/dL Final  . GFR calc non Af Amer 06/15/2015 >60  >60 mL/min Final  . GFR calc Af Amer 06/15/2015 >60  >60 mL/min Final   Comment: (NOTE) The eGFR has been calculated using the CKD EPI equation. This calculation has not been validated in all clinical situations. eGFR's persistently <60 mL/min signify possible Chronic Kidney Disease.   . Anion gap 06/15/2015 7  5 - 15 Final  . WBC 06/16/2015 19.9* 4.0 - 10.5 K/uL Final  . RBC 06/16/2015 3.54* 3.87 - 5.11 MIL/uL Final  . Hemoglobin 06/16/2015  10.4* 12.0 - 15.0 g/dL Final  . HCT 06/16/2015 31.6* 36.0 - 46.0 % Final  . MCV 06/16/2015 89.3  78.0 - 100.0 fL Final  . MCH 06/16/2015 29.4  26.0 - 34.0 pg Final  . MCHC 06/16/2015 32.9  30.0 - 36.0 g/dL Final  . RDW 06/16/2015 12.6  11.5 - 15.5 % Final  . Platelets 06/16/2015 165  150 - 400 K/uL Final  . Sodium 06/16/2015 144  135 - 145 mmol/L Final   Comment: DELTA CHECK NOTED REPEATED TO VERIFY   . Potassium 06/16/2015 4.7  3.5 - 5.1 mmol/L Final  . Chloride 06/16/2015 109  101 - 111 mmol/L Final  . CO2 06/16/2015 28  22 - 32 mmol/L Final  . Glucose, Bld 06/16/2015 135* 65 - 99 mg/dL Final  . BUN 06/16/2015 13  6 - 20 mg/dL Final  . Creatinine, Ser 06/16/2015 0.55  0.44 - 1.00 mg/dL Final  . Calcium 06/16/2015 9.1  8.9 - 10.3 mg/dL Final  . GFR calc non Af Amer 06/16/2015 >60  >60 mL/min Final  . GFR calc Af Amer 06/16/2015 >60  >60  mL/min Final   Comment: (NOTE) The eGFR has been calculated using the CKD EPI equation. This calculation has not been validated in all clinical situations. eGFR's persistently <60 mL/min signify possible Chronic Kidney Disease.   . Anion gap 06/16/2015 7  5 - 15 Final  Appointment on 06/07/2015  Component Date Value Ref Range Status  . WBC 06/07/2015 6.6  3.9 - 10.3 10e3/uL Final  . NEUT# 06/07/2015 4.3  1.5 - 6.5 10e3/uL Final  . HGB 06/07/2015 12.4  11.6 - 15.9 g/dL Final  . HCT 06/07/2015 37.4  34.8 - 46.6 % Final  . Platelets 06/07/2015 179  145 - 400 10e3/uL Final  . MCV 06/07/2015 89.2  79.5 - 101.0 fL Final  . MCH 06/07/2015 29.5  25.1 - 34.0 pg Final  . MCHC 06/07/2015 33.1  31.5 - 36.0 g/dL Final  . RBC 06/07/2015 4.19  3.70 - 5.45 10e6/uL Final  . RDW 06/07/2015 12.4  11.2 - 14.5 % Final  . lymph# 06/07/2015 1.5  0.9 - 3.3 10e3/uL Final  . MONO# 06/07/2015 0.6  0.1 - 0.9 10e3/uL Final  . Eosinophils Absolute 06/07/2015 0.2  0.0 - 0.5 10e3/uL Final  . Basophils Absolute 06/07/2015 0.0  0.0 - 0.1 10e3/uL Final  . NEUT% 06/07/2015 64.7  38.4 - 76.8 % Final  . LYMPH% 06/07/2015 23.3  14.0 - 49.7 % Final  . MONO% 06/07/2015 8.6  0.0 - 14.0 % Final  . EOS% 06/07/2015 2.7  0.0 - 7.0 % Final  . BASO% 06/07/2015 0.7  0.0 - 2.0 % Final  . Sodium 06/07/2015 140  136 - 145 mEq/L Final  . Potassium 06/07/2015 4.2  3.5 - 5.1 mEq/L Final  . Chloride 06/07/2015 107  98 - 109 mEq/L Final  . CO2 06/07/2015 27  22 - 29 mEq/L Final  . Glucose 06/07/2015 94  70 - 140 mg/dl Final   Glucose reference range is for nonfasting patients. Fasting glucose reference range is 70- 100.  Marland Kitchen BUN 06/07/2015 18.5  7.0 - 26.0 mg/dL Final  . Creatinine 06/07/2015 1.1  0.6 - 1.1 mg/dL Final  . Total Bilirubin 06/07/2015 0.35  0.20 - 1.20 mg/dL Final  . Alkaline Phosphatase 06/07/2015 54  40 - 150 U/L Final  . AST 06/07/2015 16  5 - 34 U/L Final  . ALT 06/07/2015 <9  0 - 55  U/L Final  . Total Protein  06/07/2015 6.4  6.4 - 8.3 g/dL Final  . Albumin 06/07/2015 3.5  3.5 - 5.0 g/dL Final  . Calcium 06/07/2015 8.8  8.4 - 10.4 mg/dL Final  . Anion Gap 06/07/2015 7  3 - 11 mEq/L Final  . EGFR 06/07/2015 47* >90 ml/min/1.73 m2 Final   eGFR is calculated using the CKD-EPI Creatinine Equation (2009)  Hospital Outpatient Visit on 06/06/2015  Component Date Value Ref Range Status  . aPTT 06/06/2015 30  24 - 37 seconds Final  . WBC 06/06/2015 7.3  4.0 - 10.5 K/uL Final  . RBC 06/06/2015 4.23  3.87 - 5.11 MIL/uL Final  . Hemoglobin 06/06/2015 12.8  12.0 - 15.0 g/dL Final  . HCT 06/06/2015 39.3  36.0 - 46.0 % Final  . MCV 06/06/2015 92.9  78.0 - 100.0 fL Final  . MCH 06/06/2015 30.3  26.0 - 34.0 pg Final  . MCHC 06/06/2015 32.6  30.0 - 36.0 g/dL Final  . RDW 06/06/2015 12.7  11.5 - 15.5 % Final  . Platelets 06/06/2015 181  150 - 400 K/uL Final  . Sodium 06/06/2015 138  135 - 145 mmol/L Final  . Potassium 06/06/2015 4.5  3.5 - 5.1 mmol/L Final  . Chloride 06/06/2015 105  101 - 111 mmol/L Final  . CO2 06/06/2015 25  22 - 32 mmol/L Final  . Glucose, Bld 06/06/2015 91  65 - 99 mg/dL Final  . BUN 06/06/2015 16  6 - 20 mg/dL Final  . Creatinine, Ser 06/06/2015 0.73  0.44 - 1.00 mg/dL Final  . Calcium 06/06/2015 9.3  8.9 - 10.3 mg/dL Final  . Total Protein 06/06/2015 6.7  6.5 - 8.1 g/dL Final  . Albumin 06/06/2015 4.1  3.5 - 5.0 g/dL Final  . AST 06/06/2015 23  15 - 41 U/L Final  . ALT 06/06/2015 6* 14 - 54 U/L Final  . Alkaline Phosphatase 06/06/2015 52  38 - 126 U/L Final  . Total Bilirubin 06/06/2015 0.7  0.3 - 1.2 mg/dL Final  . GFR calc non Af Amer 06/06/2015 >60  >60 mL/min Final  . GFR calc Af Amer 06/06/2015 >60  >60 mL/min Final   Comment: (NOTE) The eGFR has been calculated using the CKD EPI equation. This calculation has not been validated in all clinical situations. eGFR's persistently <60 mL/min signify possible Chronic Kidney Disease.   . Anion gap 06/06/2015 8  5 - 15 Final  .  Prothrombin Time 06/06/2015 14.3  11.6 - 15.2 seconds Final  . INR 06/06/2015 1.09  0.00 - 1.49 Final  . ABO/RH(D) 06/06/2015 A POS   Final  . Antibody Screen 06/06/2015 NEG   Final  . Sample Expiration 06/06/2015 06/17/2015   Final  . Extend sample reason 06/06/2015 NO TRANSFUSIONS OR PREGNANCY IN THE PAST 3 MONTHS   Final  . Color, Urine 06/06/2015 YELLOW  YELLOW Final  . APPearance 06/06/2015 TURBID* CLEAR Final  . Specific Gravity, Urine 06/06/2015 1.013  1.005 - 1.030 Final  . pH 06/06/2015 5.5  5.0 - 8.0 Final  . Glucose, UA 06/06/2015 NEGATIVE  NEGATIVE mg/dL Final  . Hgb urine dipstick 06/06/2015 SMALL* NEGATIVE Final  . Bilirubin Urine 06/06/2015 NEGATIVE  NEGATIVE Final  . Ketones, ur 06/06/2015 NEGATIVE  NEGATIVE mg/dL Final  . Protein, ur 06/06/2015 NEGATIVE  NEGATIVE mg/dL Final  . Nitrite 06/06/2015 POSITIVE* NEGATIVE Final  . Leukocytes, UA 06/06/2015 LARGE* NEGATIVE Final  . MRSA, PCR 06/06/2015 NEGATIVE  NEGATIVE Final  .  Staphylococcus aureus 06/06/2015 NEGATIVE  NEGATIVE Final   Comment:        The Xpert SA Assay (FDA approved for NASAL specimens in patients over 48 years of age), is one component of a comprehensive surveillance program.  Test performance has been validated by [facility] for patients greater than or equal to 22 year old. It is not intended to diagnose infection nor to guide or monitor treatment.   . Squamous Epithelial / LPF 06/06/2015 NONE SEEN  NONE SEEN Final  . WBC, UA 06/06/2015 TOO NUMEROUS TO COUNT  0 - 5 WBC/hpf Final  . RBC / HPF 06/06/2015 0-5  0 - 5 RBC/hpf Final  . Bacteria, UA 06/06/2015 FEW* NONE SEEN Final  . ABO/RH(D) 06/06/2015 A POS   Final  Appointment on 04/27/2015  Component Date Value Ref Range Status  . Rest HR 04/27/2015 64   Final  . Rest BP 04/27/2015 125/64   Final  . Peak HR 04/27/2015 105   Final  . Peak BP 04/27/2015 135/53   Final  . LV sys vol 04/27/2015 34   Final  . TID 04/27/2015 1.16   Final  . LV  dias vol 04/27/2015 83   Final  . LHR 04/27/2015 0.25   Final  . SSS 04/27/2015 4   Final  . SRS 04/27/2015 2   Final  . SDS 04/27/2015 2   Final     X-Rays:Dg Pelvis Portable  06/14/2015  CLINICAL DATA:  Status post right hip replacement EXAM: PORTABLE PELVIS 1-2 VIEWS COMPARISON:  None. FINDINGS: A right hip replacement is noted. A surgical drain is noted in place. No acute soft tissue abnormality is noted. IMPRESSION: Status post right hip replacement. Electronically Signed   By: Inez Catalina M.D.   On: 06/14/2015 15:38   Dg C-arm 1-60 Min-no Report  06/14/2015  CLINICAL DATA: surgery C-ARM 1-60 MINUTES Fluoroscopy was utilized by the requesting physician.  No radiographic interpretation.    EKG: Orders placed or performed in visit on 04/21/15  . EKG 12-Lead     Hospital Course: Patient was admitted to [facility] and taken to the OR and underwent the above state procedure without complications.  Patient tolerated the procedure well and was later transferred to the recovery room and then to the orthopaedic floor for postoperative care.  They were given PO and IV analgesics for pain control following their surgery.  They were given 24 hours of postoperative antibiotics of  Anti-infectives    Start     Dose/Rate Route Frequency Ordered Stop   06/14/15 2000  ceFAZolin (ANCEF) IVPB 2 g/50 mL premix     2 g 100 mL/hr over 30 Minutes Intravenous Every 6 hours 06/14/15 1709 06/15/15 0219   06/14/15 1245  ceFAZolin (ANCEF) IVPB 2 g/50 mL premix     2 g 100 mL/hr over 30 Minutes Intravenous NOW 06/14/15 1227 06/14/15 1332     and started on DVT prophylaxis in the form of Xarelto.   PT and OT were ordered for total hip protocol.  The patient was allowed to be WBAT with therapy. Discharge planning was consulted to help with postop disposition and equipment needs.  Patient had a decent night on the evening of surgery.  They started to get up OOB with therapy on day one.  Hemovac drain was  pulled without difficulty.  Continued to work with therapy into day two.  Dressing was changed on day two and the incision was healing well.  Patient was seen in rounds on POD 2 and was ready to go home.  Discharge home with home health Diet - Regular diet Follow up - in 2 weeks Activity - WBAT Disposition - Home Condition Upon Discharge - Good D/C Meds - See DC Summary DVT Prophylaxis - Xarelto      Discharge Instructions    Call MD / Call 911    Complete by:  As directed   If you experience chest pain or shortness of breath, CALL 911 and be transported to the hospital emergency room.  If you develope a fever above 101 F, pus (white drainage) or increased drainage or redness at the wound, or calf pain, call your surgeon's office.     Change dressing    Complete by:  As directed   You may change your dressing dressing daily with sterile 4 x 4 inch gauze dressing and paper tape.  Do not submerge the incision under water.     Constipation Prevention    Complete by:  As directed   Drink plenty of fluids.  Prune juice may be helpful.  You may use a stool softener, such as Colace (over the counter) 100 mg twice a day.  Use MiraLax (over the counter) for constipation as needed.     Diet general    Complete by:  As directed      Discharge instructions    Complete by:  As directed   Pick up stool softner and laxative for home use following surgery while on pain medications. Do not submerge incision under water. Please use good hand washing techniques while changing dressing each day. May shower starting three days after surgery. Please use a clean towel to pat the incision dry following showers. Continue to use ice for pain and swelling after surgery. Do not use any lotions or creams on the incision until instructed by your surgeon.  Total Hip Protocol.  Take Xarelto for two and a half more weeks, then discontinue Xarelto. Once the patient has completed the Xarelto, they may resume the  81 mg Aspirin.  Postoperative Constipation Protocol  Constipation - defined medically as fewer than three stools per week and severe constipation as less than one stool per week.  One of the most common issues patients have following surgery is constipation.  Even if you have a regular bowel pattern at home, your normal regimen is likely to be disrupted due to multiple reasons following surgery.  Combination of anesthesia, postoperative narcotics, change in appetite and fluid intake all can affect your bowels.  In order to avoid complications following surgery, here are some recommendations in order to help you during your recovery period.  Colace (docusate) - Pick up an over-the-counter form of Colace or another stool softener and take twice a day as long as you are requiring postoperative pain medications.  Take with a full glass of water daily.  If you experience loose stools or diarrhea, hold the colace until you stool forms back up.  If your symptoms do not get better within 1 week or if they get worse, check with your doctor.  Dulcolax (bisacodyl) - Pick up over-the-counter and take as directed by the product packaging as needed to assist with the movement of your bowels.  Take with a full glass of water.  Use this product as needed if not relieved by Colace only.   MiraLax (polyethylene glycol) - Pick up over-the-counter to have on hand.  MiraLax is a solution that will increase  the amount of water in your bowels to assist with bowel movements.  Take as directed and can mix with a glass of water, juice, soda, coffee, or tea.  Take if you go more than two days without a movement. Do not use MiraLax more than once per day. Call your doctor if you are still constipated or irregular after using this medication for 7 days in a row.  If you continue to have problems with postoperative constipation, please contact the office for further assistance and recommendations.  If you experience "the worst  abdominal pain ever" or develop nausea or vomiting, please contact the office immediatly for further recommendations for treatment.     Do not sit on low chairs, stoools or toilet seats, as it may be difficult to get up from low surfaces    Complete by:  As directed      Driving restrictions    Complete by:  As directed   No driving until released by the physician.     Increase activity slowly as tolerated    Complete by:  As directed      Lifting restrictions    Complete by:  As directed   No lifting until released by the physician.     Patient may shower    Complete by:  As directed   You may shower without a dressing once there is no drainage.  Do not wash over the wound.  If drainage remains, do not shower until drainage stops.     TED hose    Complete by:  As directed   Use stockings (TED hose) for 3 weeks on both leg(s).  You may remove them at night for sleeping.     Weight bearing as tolerated    Complete by:  As directed   Laterality:  right  Extremity:  Lower            Medication List    STOP taking these medications        ALEVE 220 MG tablet  Generic drug:  naproxen sodium     aspirin 81 MG tablet     calcium carbonate 600 MG Tabs tablet  Commonly known as:  OS-CAL     cholecalciferol 1000 units tablet  Commonly known as:  VITAMIN D     fish oil-omega-3 fatty acids 1000 MG capsule     glucosamine-chondroitin 500-400 MG tablet     multivitamin tablet      TAKE these medications        methocarbamol 500 MG tablet  Commonly known as:  ROBAXIN  Take 1 tablet (500 mg total) by mouth every 6 (six) hours as needed for muscle spasms.     oxyCODONE 5 MG immediate release tablet  Commonly known as:  Oxy IR/ROXICODONE  Take 1-2 tablets (5-10 mg total) by mouth every 3 (three) hours as needed for moderate pain or severe pain.     rivaroxaban 10 MG Tabs tablet  Commonly known as:  XARELTO  Take 1 tablet (10 mg total) by mouth daily with breakfast. Take  Xarelto for two and a half more weeks, then discontinue Xarelto. Once the patient has completed the Xarelto, they may resume the 81 mg Aspirin.     tamoxifen 20 MG tablet  Commonly known as:  NOLVADEX  Take 1 tablet (20 mg total) by mouth daily.     traMADol 50 MG tablet  Commonly known as:  ULTRAM  Take 1-2 tablets (50-100 mg total) by mouth  every 6 (six) hours as needed (mild pain).       Follow-up Information    Follow up with [facility].   Why:  home health physical therapy   Contact information:   [place] [address] [phone]       Follow up with Gearlean Alf, MD. Schedule an appointment as soon as possible for a visit on 06/27/2015.   Specialty:  Orthopedic Surgery   Why:  Call office at [phone] to setup appointment on Tuesday 3/21 with the PA, Dian Situ.   Contact information:   [address] [place] [address] [phone]       Signed: [name], PA-C Orthopaedic Surgery 06/22/2015, 8:45 AM

## 2015-06-29 ENCOUNTER — Other Ambulatory Visit: Payer: Medicare Other

## 2015-06-29 ENCOUNTER — Ambulatory Visit: Payer: Medicare Other | Admitting: Oncology

## 2015-09-29 ENCOUNTER — Encounter: Payer: Self-pay | Admitting: Genetic Counselor

## 2016-02-21 ENCOUNTER — Other Ambulatory Visit: Payer: Self-pay | Admitting: Family Medicine

## 2016-02-21 DIAGNOSIS — Z853 Personal history of malignant neoplasm of breast: Secondary | ICD-10-CM

## 2016-03-06 ENCOUNTER — Ambulatory Visit
Admission: RE | Admit: 2016-03-06 | Discharge: 2016-03-06 | Disposition: A | Discharge status: Home / Self Care | Payer: Medicare Other | Source: Ambulatory Visit | Attending: Family Medicine | Admitting: Family Medicine

## 2016-03-06 DIAGNOSIS — Z853 Personal history of malignant neoplasm of breast: Secondary | ICD-10-CM

## 2016-04-18 IMAGING — MG MM DIGITAL DIAGNOSTIC BILAT CAD
5 series · 5 of 5 positions shown · non-contrast
Comparison: Priors

CLINICAL DATA: History of left lumpectomy for breast cancer 3723.

EXAM:
DIGITAL DIAGNOSTIC  bilateral MAMMOGRAM WITH CAD

[R CC]
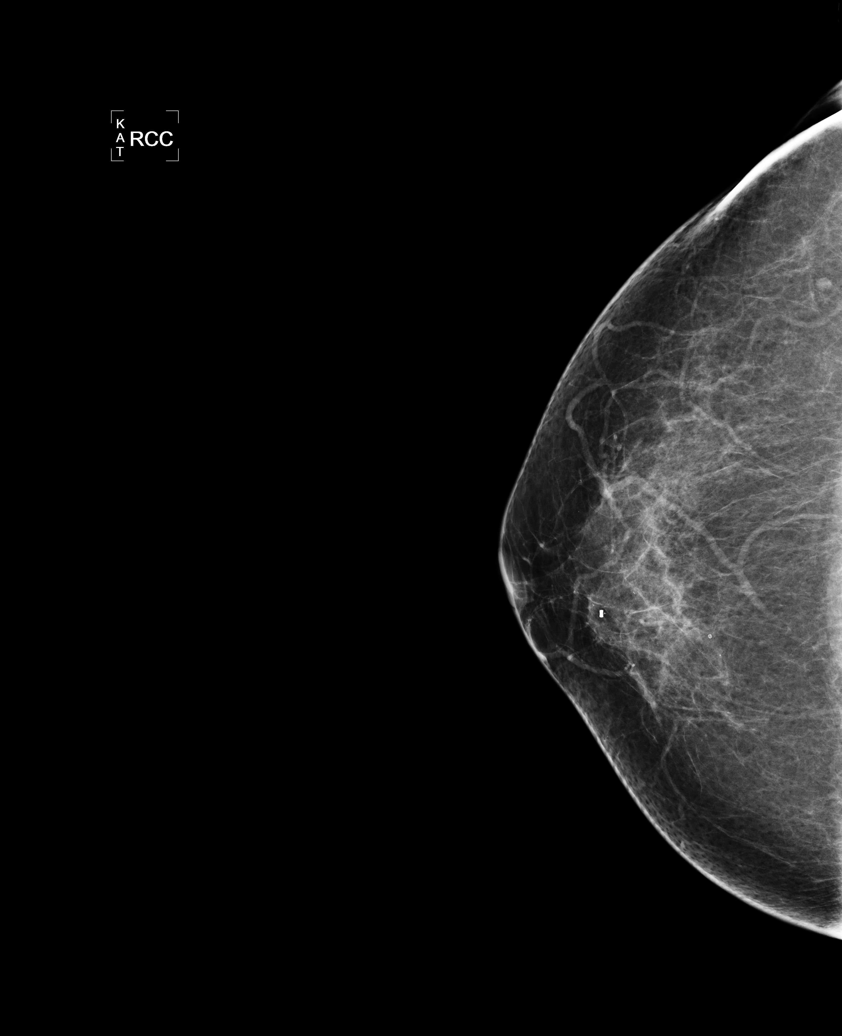

[L CC]
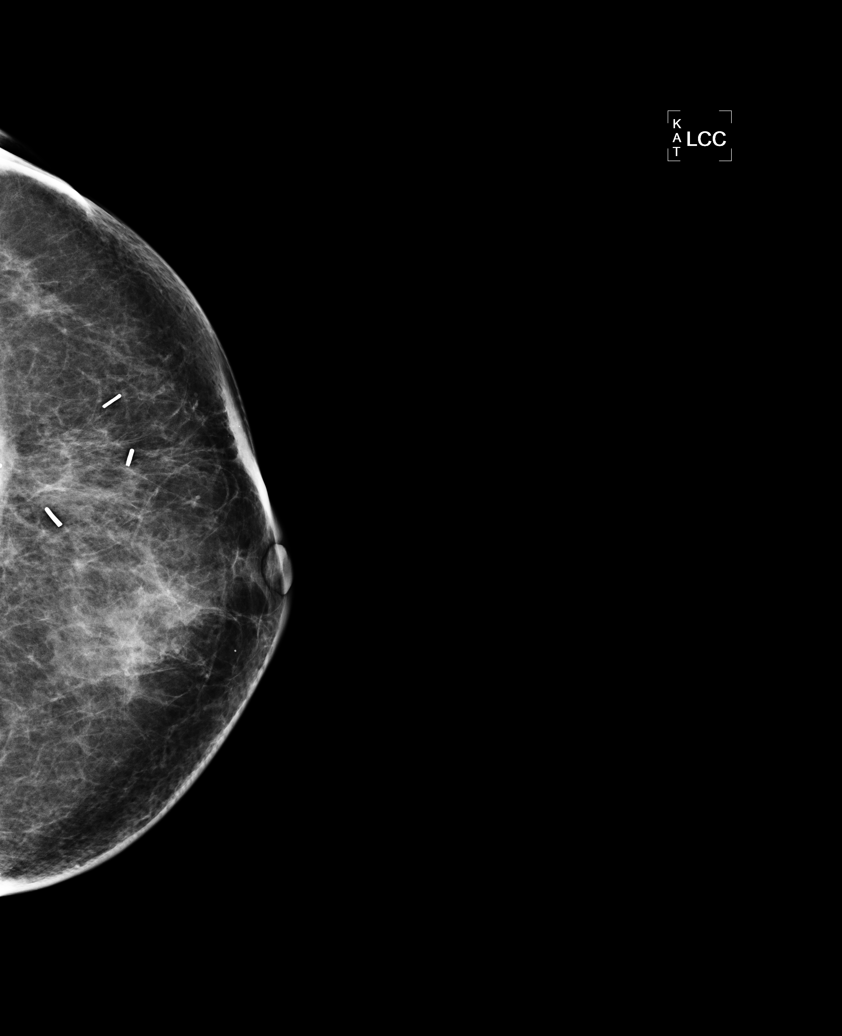

[L MLO]
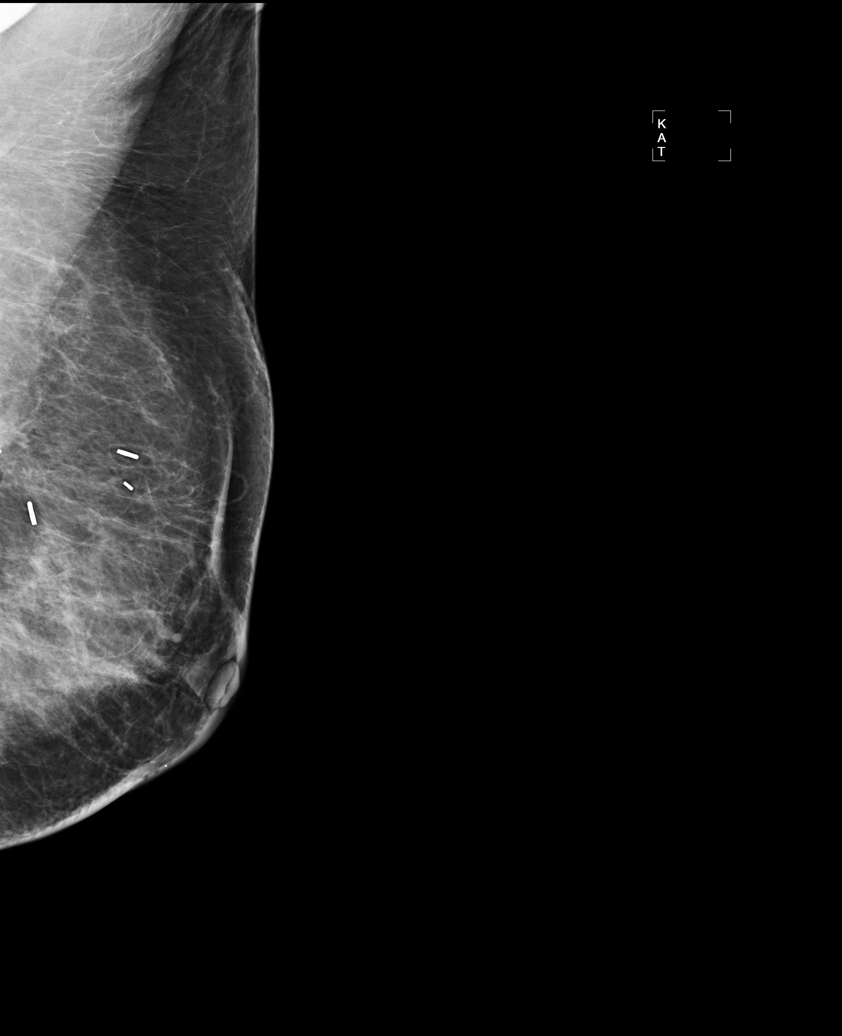

[R MLO]
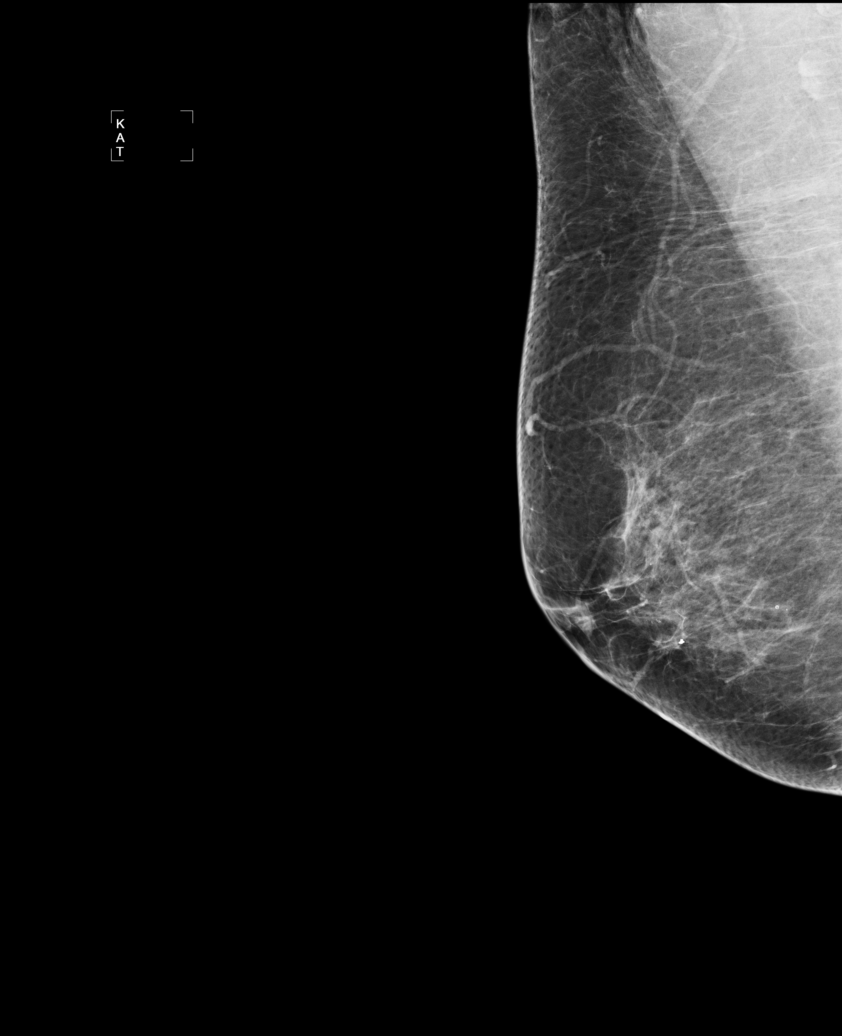

[L TAN]
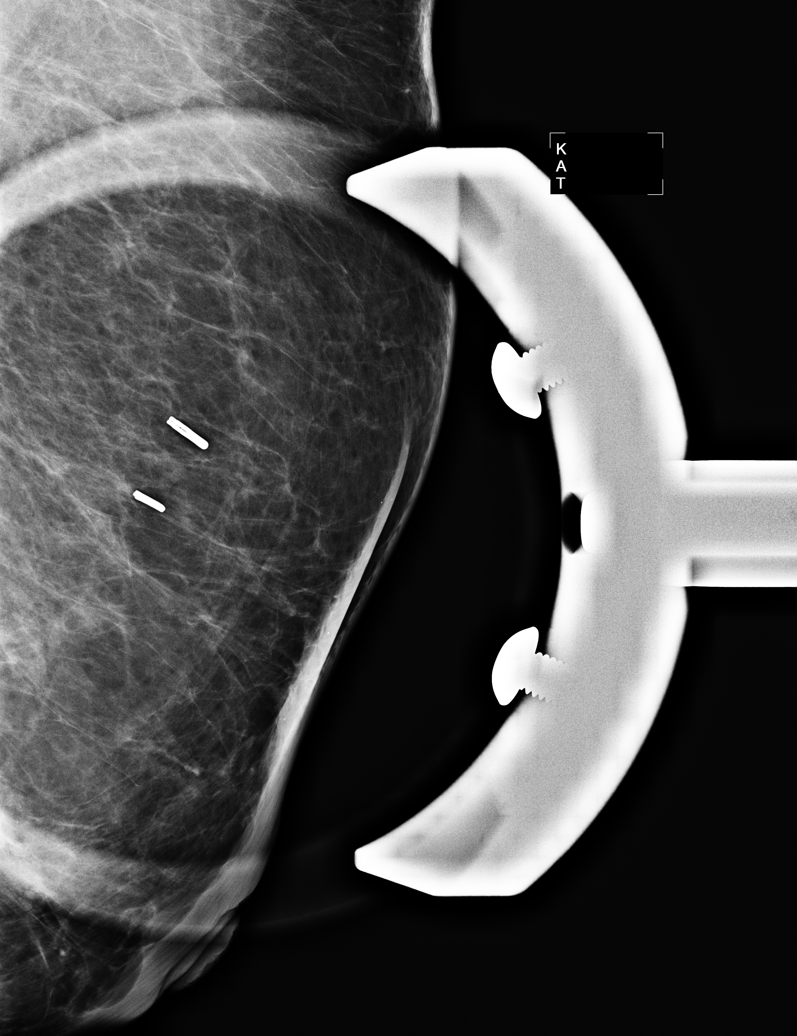

[5 of 5 positions shown; findings below may reference images not displayed]

ACR Breast Density Category b: There are scattered areas of
fibroglandular density.
FINDINGS: Left lumpectomy changes are noted. There is a group of round
calcifications in the region of the lumpectomy site, visualize
previously in the left upper outer quadrant, previously posterior to
the biopsy proven breast cancer, stable since the least remote
available comparison exam 1161. Evidence of benign right breast
biopsy reidentified. No new suspicious finding is seen in either
breast.

Mammographic images were processed with CAD.
IMPRESSION: Stable left lumpectomy change, no evidence for malignancy in either
breast.

RECOMMENDATION:
Diagnostic mammogram is suggested in 1 year. (Code:[id number]-9X5)

I have discussed the findings and recommendations with the patient.
Results were also provided in writing at the conclusion of the
visit. If applicable, a reminder letter will be sent to the patient
regarding the next appointment.

BI-RADS CATEGORY  2: Benign.

## 2017-01-27 ENCOUNTER — Other Ambulatory Visit: Payer: Self-pay | Admitting: Family Medicine

## 2017-01-27 DIAGNOSIS — Z853 Personal history of malignant neoplasm of breast: Secondary | ICD-10-CM

## 2017-02-15 ENCOUNTER — Other Ambulatory Visit: Payer: Self-pay | Admitting: Nurse Practitioner

## 2017-03-07 ENCOUNTER — Ambulatory Visit
Admission: RE | Admit: 2017-03-07 | Discharge: 2017-03-07 | Disposition: A | Discharge status: Home / Self Care | Payer: Medicare Other | Source: Ambulatory Visit | Attending: Family Medicine | Admitting: Family Medicine

## 2017-03-07 ENCOUNTER — Other Ambulatory Visit: Payer: Self-pay | Admitting: Family Medicine

## 2017-03-07 DIAGNOSIS — Z853 Personal history of malignant neoplasm of breast: Secondary | ICD-10-CM

## 2017-08-14 ENCOUNTER — Other Ambulatory Visit: Payer: Self-pay | Admitting: Orthopedic Surgery

## 2017-08-14 DIAGNOSIS — M25511 Pain in right shoulder: Secondary | ICD-10-CM

## 2017-08-19 ENCOUNTER — Ambulatory Visit
Admission: RE | Admit: 2017-08-19 | Discharge: 2017-08-19 | Disposition: A | Discharge status: Home / Self Care | Payer: Medicare Other | Source: Ambulatory Visit | Attending: Orthopedic Surgery | Admitting: Orthopedic Surgery

## 2017-08-19 DIAGNOSIS — M25511 Pain in right shoulder: Secondary | ICD-10-CM

## 2018-01-26 ENCOUNTER — Other Ambulatory Visit: Payer: Self-pay | Admitting: Family Medicine

## 2018-01-26 DIAGNOSIS — Z1231 Encounter for screening mammogram for malignant neoplasm of breast: Secondary | ICD-10-CM

## 2018-03-10 ENCOUNTER — Ambulatory Visit
Admission: RE | Admit: 2018-03-10 | Discharge: 2018-03-10 | Disposition: A | Discharge status: Home / Self Care | Payer: Medicare Other | Source: Ambulatory Visit | Attending: Family Medicine | Admitting: Family Medicine

## 2018-03-10 DIAGNOSIS — Z1231 Encounter for screening mammogram for malignant neoplasm of breast: Secondary | ICD-10-CM

## 2018-03-17 ENCOUNTER — Other Ambulatory Visit: Payer: Self-pay | Admitting: Family Medicine

## 2018-03-18 ENCOUNTER — Other Ambulatory Visit: Payer: Self-pay | Admitting: Family Medicine

## 2018-03-18 DIAGNOSIS — M8580 Other specified disorders of bone density and structure, unspecified site: Secondary | ICD-10-CM

## 2018-05-26 ENCOUNTER — Other Ambulatory Visit: Payer: Medicare Other

## 2018-07-21 ENCOUNTER — Other Ambulatory Visit: Payer: Medicare Other

## 2018-09-25 ENCOUNTER — Ambulatory Visit
Admission: RE | Admit: 2018-09-25 | Discharge: 2018-09-25 | Disposition: A | Discharge status: Home / Self Care | Payer: Medicare Other | Source: Ambulatory Visit | Attending: Family Medicine | Admitting: Family Medicine

## 2018-09-25 ENCOUNTER — Other Ambulatory Visit: Payer: Self-pay

## 2018-09-25 DIAGNOSIS — M8580 Other specified disorders of bone density and structure, unspecified site: Secondary | ICD-10-CM

## 2019-02-09 ENCOUNTER — Other Ambulatory Visit: Payer: Self-pay

## 2019-02-09 DIAGNOSIS — Z20822 Contact with and (suspected) exposure to covid-19: Secondary | ICD-10-CM

## 2019-02-11 LAB — NOVEL CORONAVIRUS, NAA: SARS-CoV-2, NAA: NOT DETECTED

## 2019-02-17 ENCOUNTER — Other Ambulatory Visit: Payer: Self-pay | Admitting: Family Medicine

## 2019-02-17 DIAGNOSIS — Z1231 Encounter for screening mammogram for malignant neoplasm of breast: Secondary | ICD-10-CM

## 2019-04-13 ENCOUNTER — Ambulatory Visit
Admission: RE | Admit: 2019-04-13 | Discharge: 2019-04-13 | Disposition: A | Discharge status: Home / Self Care | Payer: Medicare Other | Source: Ambulatory Visit | Attending: Family Medicine | Admitting: Family Medicine

## 2019-04-13 ENCOUNTER — Ambulatory Visit: Payer: Medicare Other

## 2019-04-13 ENCOUNTER — Other Ambulatory Visit: Payer: Self-pay

## 2019-04-13 DIAGNOSIS — Z1231 Encounter for screening mammogram for malignant neoplasm of breast: Secondary | ICD-10-CM

## 2019-11-17 ENCOUNTER — Encounter: Payer: Self-pay | Admitting: Genetic Counselor

## 2019-12-08 ENCOUNTER — Inpatient Hospital Stay: Payer: Medicare Other

## 2019-12-08 ENCOUNTER — Inpatient Hospital Stay: Payer: Medicare Other | Attending: Genetic Counselor | Admitting: Genetic Counselor

## 2019-12-08 ENCOUNTER — Other Ambulatory Visit: Payer: Self-pay

## 2019-12-08 ENCOUNTER — Encounter: Payer: Self-pay | Admitting: Genetic Counselor

## 2019-12-08 DIAGNOSIS — Z800 Family history of malignant neoplasm of digestive organs: Secondary | ICD-10-CM | POA: Diagnosis not present

## 2019-12-08 DIAGNOSIS — Z853 Personal history of malignant neoplasm of breast: Secondary | ICD-10-CM

## 2019-12-08 DIAGNOSIS — Z803 Family history of malignant neoplasm of breast: Secondary | ICD-10-CM | POA: Insufficient documentation

## 2019-12-08 HISTORY — DX: Family history of malignant neoplasm of digestive organs: Z80.0

## 2019-12-08 NOTE — Progress Notes (Signed)
REFERRING PROVIDER: Chauncey Cruel, MD [place],  [place] [address]  PRIMARY PROVIDER:  Leighton Ruff, MD  PRIMARY REASON FOR VISIT:  1. Family history of breast cancer   2. Personal history of malignant neoplasm of breast   3. Family history of pancreatic cancer      HISTORY OF PRESENT ILLNESS:  I connected with  Anne Obrien on 12/08/2019 at 9 AM EDT by MyChart video conference and verified that I am speaking with the correct person using two identifiers.   Patient location: Home Provider location: [facility]  Anne Obrien, a 79 y.o. female, was seen for a [facility] cancer genetics consultation as a self referral, and is a patient of Dr. Jana Hakim , due to a personal and family history of cancer.  [name] presents to clinic today to discuss the possibility of a hereditary predisposition to cancer, genetic testing, and to further clarify her future cancer risks, as well as potential cancer risks for family members.   In October 2011, at the age of 11, Anne Obrien was diagnosed with Invasive ductal carcinoma of the right breast. The treatment plan lumpectomy and radiation. At the time of diagnosis, Anne Obrien underwent genetic testing and was negative for BRCA mutations.    CANCER HISTORY:  Oncology History   No history exists.     RISK FACTORS:  Menarche was at age 61.  First live birth at age 58.  OCP use for approximately 0 years.  Ovaries intact: yes.  Hysterectomy: yes.  Menopausal status: postmenopausal.  HRT use: 0 years. Colonoscopy: yes; normal. Mammogram within the last year: yes. Number of breast biopsies: 1. Up to date with pelvic exams: n/a. Any excessive radiation exposure in the past: no  Past Medical History:  Diagnosis Date  . Arthritis   . Breast cancer ([place])    stage I left  . Bundle branch block   . Bursitis   . Cancer [facility])    face  . Chronic cystitis   . Chronic pain   . Coronary artery disease   . Degenerative  disc disease, lumbar   . Family history of adverse reaction to anesthesia    pt states her son has to have large amount of anesthesia - difficulty to go under   . Family history of breast cancer   . Family history of pancreatic cancer 12/08/2019  . GERD (gastroesophageal reflux disease)   . Hemorrhoids   . Hepatitis A 1976  . History of measles   . History of mumps   . Malaria   . Malaria   . Menopause   . Nocturia   . Osteopenia   . Osteoporosis   . Personal history of malignant neoplasm of breast   . PONV (postoperative nausea and vomiting)   . Urinary tract infection   . Varicose veins     Past Surgical History:  Procedure Laterality Date  . BARIATRIC SURGERY N/A   . BREAST LUMPECTOMY  02/2010   left lumpectomy and sentinel node  . SKIN CANCER DESTRUCTION     Nose cryotherapy  . TOTAL HIP ARTHROPLASTY Right 06/14/2015   Procedure: RIGHT TOTAL HIP ARTHROPLASTY ANTERIOR APPROACH;  Surgeon: Gaynelle Arabian, MD;  Location: [facility];  Service: Orthopedics;  Laterality: Right;  Marland Kitchen VAGINAL HYSTERECTOMY  may 2008   and vaginal repair    Social History   Socioeconomic History  . Marital status: Married    Spouse name: Not on file  . Number of children:  Not on file  . Years of education: Not on file  . Highest education level: Not on file  Occupational History  . Not on file  Tobacco Use  . Smoking status: Never Smoker  . Smokeless tobacco: Never Used  Substance and Sexual Activity  . Alcohol use: No  . Drug use: No  . Sexual activity: Yes    Birth control/protection: Post-menopausal, Surgical  Other Topics Concern  . Not on file  Social History Narrative  . Not on file   Social Determinants of Health   Financial Resource Strain:   . Difficulty of Paying Living Expenses: Not on file  Food Insecurity:   . Worried About Charity fundraiser in the Last Year: Not on file  . Ran Out of Food in the Last Year: Not on file  Transportation Needs:   . Lack of Transportation  (Medical): Not on file  . Lack of Transportation (Non-Medical): Not on file  Physical Activity:   . Days of Exercise per Week: Not on file  . Minutes of Exercise per Session: Not on file  Stress:   . Feeling of Stress : Not on file  Social Connections:   . Frequency of Communication with Friends and Family: Not on file  . Frequency of Social Gatherings with Friends and Family: Not on file  . Attends Religious Services: Not on file  . Active Member of Clubs or Organizations: Not on file  . Attends Archivist Meetings: Not on file  . Marital Status: Not on file     FAMILY HISTORY:  We obtained a detailed, 4-generation family history.  Significant diagnoses are listed below: Family History  Problem Relation Age of Onset  . Breast cancer Mother 77  . Arthritis Mother   . Pernicious anemia Father   . Arthritis Sister   . Arthritis Brother   . Aneurysm Brother   . Breast cancer Maternal Grandmother 88  . Breast cancer Maternal Aunt 74  . Cancer Maternal Uncle        NOS  . Diabetes Paternal Uncle   . Stroke Paternal Grandfather   . Aneurysm Maternal Aunt   . Breast cancer Cousin        mat first cousin  . Pancreatic cancer Cousin        mat first cousin    The patient has two sons and four daughters.  One son has had multiple skin cancers.  She has two sisters and two brothers.  One brother died of a brain hemorrhage.  Both parents are deceased.  The patient's father died at 50.  He had one brother who died of diabetes.  The paternal grandparents died of non-cancer related issues.  The patient's mother had breast cancer at 95.  She had tow sisters and three brothers.  One sister had breast cancer at 27 and one brother has an unknown cancer.  The brother has a daughter with breast cancer and another brother has a daughter who is being treated for pancreatic cancer.  The maternal grandparents are deceased.  The grandmother had breast cancer and died at 36.  Anne Obrien  is unaware of previous family history of genetic testing for hereditary cancer risks. Patient's maternal ancestors are of [place] and [place] descent, and paternal ancestors are of [place] and [place] descent. There is no reported Ashkenazi Jewish ancestry. There is no known consanguinity.   GENETIC COUNSELING ASSESSMENT: Ms. Perillo is a 79 y.o. female with a personal and family history  of breast cancer which is somewhat suggestive of a hereditary cancer syndrome and predisposition to cancer given the number of people in the family with breast cancer and the combination of breast and pancreatic cancer. We, therefore, discussed and recommended the following at today's visit.   DISCUSSION: We discussed that 5 - 10% of breast cancer is hereditary, with most cases associated with BRCA mutations.  Ms. Phetteplace was negative for BRCA mutations in 2011.  We discussed that since that time, testing for mutations in BRCA genes has improved.  Additionally, there are other genes that can be associated with hereditary breast cancer syndromes.  These include ATM, CHEK2 and PALB2.  We discussed that testing is beneficial for several reasons including knowing how to follow individuals after completing their treatment, identifying whether potential treatment options such as PARP inhibitors would be beneficial, and understand if other family members could be at risk for cancer and allow them to undergo genetic testing.   We reviewed the characteristics, features and inheritance patterns of hereditary cancer syndromes. We also discussed genetic testing, including the appropriate family members to test, the process of testing, insurance coverage and turn-around-time for results. We discussed the implications of a negative, positive, carrier and/or variant of uncertain significant result. We recommended Ms. Bunkley pursue genetic testing for the multi-cancer gene panel+RNA.  The Multi-Gene Panel offered by [facility] includes sequencing  and/or deletion duplication testing of the following 84 genes: AIP, ALK, APC, ATM, AXIN2,BAP1,  BARD1, BLM, BMPR1A, BRCA1, BRCA2, BRIP1, CASR, CDC73, CDH1, CDK4, CDKN1B, CDKN1C, CDKN2A (p14ARF), CDKN2A (p16INK4a), CEBPA, CHEK2, CTNNA1, DICER1, DIS3L2, EGFR (c.2369C>T, p.Thr790Met variant only), EPCAM (Deletion/duplication testing only), FH, FLCN, GATA2, GPC3, GREM1 (Promoter region deletion/duplication testing only), HOXB13 (c.251G>A, p.Gly84Glu), HRAS, KIT, MAX, MEN1, MET, MITF (c.952G>A, p.Glu318Lys variant only), MLH1, MSH2, MSH3, MSH6, MUTYH, NBN, NF1, NF2, NTHL1, PALB2, PDGFRA, PHOX2B, PMS2, POLD1, POLE, POT1, PRKAR1A, PTCH1, PTEN, RAD50, RAD51C, RAD51D, RB1, RECQL4, RET, RUNX1, SDHAF2, SDHA (sequence changes only), SDHB, SDHC, SDHD, SMAD4, SMARCA4, SMARCB1, SMARCE1, STK11, SUFU, TERC, TERT, TMEM127, TP53, TSC1, TSC2, VHL, WRN and WT1.    Based on Ms. Harrell's personal and family history of cancer, she meets medical criteria for genetic testing. Despite that she meets criteria, she may still have an out of pocket cost. We discussed that if her out of pocket cost for testing is over $100, the laboratory will call and confirm whether she wants to proceed with testing.  If the out of pocket cost of testing is less than $100 she will be billed by the genetic testing laboratory.   PLAN: After considering the risks, benefits, and limitations, Ms. Nauert provided informed consent to pursue genetic testing and the blood sample was sent to [facility] for analysis of the Multi-cancer gene panel+RNA. Results should be available within approximately 2-3 weeks' time, at which point they will be disclosed by telephone to Ms. Neyer, as will any additional recommendations warranted by these results. Ms. Gerald will receive a summary of her genetic counseling visit and a copy of her results once available. This information will also be available in Epic.   Lastly, we encouraged Ms. Pakula to remain in contact  with cancer genetics annually so that we can continuously update the family history and inform her of any changes in cancer genetics and testing that may be of benefit for this family.   Ms. Kenney questions were answered to her satisfaction today. Our contact information was provided should additional questions or concerns arise. Thank you for the  referral and allowing [place] to share in the care of your patient.   Mckynlee Luse P. Florene Glen, [place], [facility] Licensed, Insurance risk surveyor [email or web address] phone: [phone]  The patient was seen for a total of 35 minutes in face-to-face genetic counseling.  This patient was discussed with Drs. Magrinat, Lindi Adie and/or Burr Medico who agrees with the above.    _______________________________________________________________________ For Office Staff:  Number of people involved in session: 1 Was an Intern/ student involved with case: no

## 2020-01-10 ENCOUNTER — Encounter: Payer: Self-pay | Admitting: Genetic Counselor

## 2020-01-10 DIAGNOSIS — Z1379 Encounter for other screening for genetic and chromosomal anomalies: Secondary | ICD-10-CM | POA: Insufficient documentation

## 2020-01-11 ENCOUNTER — Telehealth: Payer: Self-pay | Admitting: Genetic Counselor

## 2020-01-11 NOTE — Telephone Encounter (Signed)
LM on VM that results are back and to please call. 

## 2020-01-12 ENCOUNTER — Encounter: Payer: Self-pay | Admitting: Genetic Counselor

## 2020-01-12 ENCOUNTER — Ambulatory Visit: Payer: Self-pay | Admitting: Genetic Counselor

## 2020-01-12 DIAGNOSIS — Z1379 Encounter for other screening for genetic and chromosomal anomalies: Secondary | ICD-10-CM

## 2020-01-12 NOTE — Telephone Encounter (Signed)
Revealed negative genetic testing.  Discussed that we do not know why she has breast cancer or why there is cancer in the family. It could be due to a different gene that we are not testing, or maybe our current technology may not be able to pick something up.  It will be important for her to keep in contact with genetics to keep up with whether additional testing may be needed. 

## 2020-01-12 NOTE — Telephone Encounter (Signed)
LM on VM that results are back and to please call. 

## 2020-01-12 NOTE — Progress Notes (Signed)
HPI:  Anne Obrien was previously seen in the [facility] clinic due to a personal and family history of breast cancer and concerns regarding a hereditary predisposition to cancer. Please refer to our prior cancer genetics clinic note for more information regarding our discussion, assessment and recommendations, at the time. Anne Obrien recent genetic test results were disclosed to her, as were recommendations warranted by these results. These results and recommendations are discussed in more detail below.  CANCER HISTORY:  Oncology History  Breast cancer, left breast ([place])  01/14/2011 Initial Diagnosis   Breast cancer, left breast ([place])   01/07/2020 Genetic Testing   Negative genetic testing on the multicancer panel.  The Multi-Gene Panel offered by [facility] includes sequencing and/or deletion duplication testing of the following 84 genes: AIP, ALK, APC, ATM, AXIN2,BAP1,  BARD1, BLM, BMPR1A, BRCA1, BRCA2, BRIP1, CASR, CDC73, CDH1, CDK4, CDKN1B, CDKN1C, CDKN2A (p14ARF), CDKN2A (p16INK4a), CEBPA, CHEK2, CTNNA1, DICER1, DIS3L2, EGFR (c.2369C>T, p.Thr790Met variant only), EPCAM (Deletion/duplication testing only), FH, FLCN, GATA2, GPC3, GREM1 (Promoter region deletion/duplication testing only), HOXB13 (c.251G>A, p.Gly84Glu), HRAS, KIT, MAX, MEN1, MET, MITF (c.952G>A, p.Glu318Lys variant only), MLH1, MSH2, MSH3, MSH6, MUTYH, NBN, NF1, NF2, NTHL1, PALB2, PDGFRA, PHOX2B, PMS2, POLD1, POLE, POT1, PRKAR1A, PTCH1, PTEN, RAD50, RAD51C, RAD51D, RB1, RECQL4, RET, RUNX1, SDHAF2, SDHA (sequence changes only), SDHB, SDHC, SDHD, SMAD4, SMARCA4, SMARCB1, SMARCE1, STK11, SUFU, TERC, TERT, TMEM127, TP53, TSC1, TSC2, VHL, WRN and WT1.  The report date is January 07, 2020.     FAMILY HISTORY:  We obtained a detailed, 4-generation family history.  Significant diagnoses are listed below: Family History  Problem Relation Age of Onset  . Breast cancer Mother 68  . Arthritis Mother   . Pernicious anemia Father     . Arthritis Sister   . Arthritis Brother   . Aneurysm Brother   . Breast cancer Maternal Grandmother 4  . Breast cancer Maternal Aunt 59  . Cancer Maternal Uncle        NOS  . Diabetes Paternal Uncle   . Stroke Paternal Grandfather   . Aneurysm Maternal Aunt   . Breast cancer Cousin        mat first cousin  . Ovarian cancer Cousin        mat first cousin  . Cancer Maternal Uncle        blood cancer    The patient has two sons and four daughters.  One son has had multiple skin cancers.  She has two sisters and two brothers.  One brother died of a brain hemorrhage.  Both parents are deceased.  The patient's father died at 69.  He had one brother who died of diabetes.  The paternal grandparents died of non-cancer related issues.  The patient's mother had breast cancer at 95.  She had two sisters and three brothers.  One sister had breast cancer at 32 and one brother has an unknown cancer.  The brother has a daughter with breast cancer and another brother had a blood cancer and has a daughter who recently passed away from ovarian cancer.  The maternal grandparents are deceased.  The grandmother had breast cancer and died at 13.  Anne Obrien is unaware of previous family history of genetic testing for hereditary cancer risks. Patient's maternal ancestors are of [place] and [place] descent, and paternal ancestors are of [place] and [place] descent. There is no reported Ashkenazi Jewish ancestry. There is no known consanguinity.     GENETIC TEST RESULTS: Genetic testing reported out  on January 07, 2020 through the multi-cancer panel found no pathogenic mutations. The Multi-Gene Panel offered by [facility] includes sequencing and/or deletion duplication testing of the following 85 genes: AIP, ALK, APC, ATM, AXIN2,BAP1,  BARD1, BLM, BMPR1A, BRCA1, BRCA2, BRIP1, CASR, CDC73, CDH1, CDK4, CDKN1B, CDKN1C, CDKN2A (p14ARF), CDKN2A (p16INK4a), CEBPA, CHEK2, CTNNA1, DICER1, DIS3L2, EGFR (c.2369C>T,  p.Thr790Met variant only), EPCAM (Deletion/duplication testing only), FH, FLCN, GATA2, GPC3, GREM1 (Promoter region deletion/duplication testing only), HOXB13 (c.251G>A, p.Gly84Glu), HRAS, KIT, MAX, MEN1, MET, MITF (c.952G>A, p.Glu318Lys variant only), MLH1, MSH2, MSH3, MSH6, MUTYH, NBN, NF1, NF2, NTHL1, PALB2, PDGFRA, PHOX2B, PMS2, POLD1, POLE, POT1, PRKAR1A, PTCH1, PTEN, RAD50, RAD51C, RAD51D, RB1, RECQL4, RET, RNF43, RUNX1, SDHAF2, SDHA (sequence changes only), SDHB, SDHC, SDHD, SMAD4, SMARCA4, SMARCB1, SMARCE1, STK11, SUFU, TERC, TERT, TMEM127, TP53, TSC1, TSC2, VHL, WRN and WT1. The test report has been scanned into EPIC and is located under the Molecular Pathology section of the Results Review tab.  A portion of the result report is included below for reference.     We discussed with Anne Obrien that because current genetic testing is not perfect, it is possible there may be a gene mutation in one of these genes that current testing cannot detect, but that chance is small.  We also discussed, that there could be another gene that has not yet been discovered, or that we have not yet tested, that is responsible for the cancer diagnoses in the family. It is also possible there is a hereditary cause for the cancer in the family that Anne Obrien did not inherit and therefore was not identified in her testing.  Therefore, it is important to remain in touch with cancer genetics in the future so that we can continue to offer Anne Obrien the most up to date genetic testing.   ADDITIONAL GENETIC TESTING: We discussed with Anne Obrien that her genetic testing was fairly extensive.  If there are genes identified to increase cancer risk that can be analyzed in the future, we would be happy to discuss and coordinate this testing at that time.    CANCER SCREENING RECOMMENDATIONS: Anne Obrien test result is considered negative (normal).  This means that we have not identified a hereditary cause for her personal and  family history of breast cancer at this time. Most cancers happen by chance and this negative test suggests that her cancer may fall into this category.    While reassuring, this does not definitively rule out a hereditary predisposition to cancer. It is still possible that there could be genetic mutations that are undetectable by current technology. There could be genetic mutations in genes that have not been tested or identified to increase cancer risk.  Therefore, it is recommended she continue to follow the cancer management and screening guidelines provided by her oncology and primary healthcare provider.   An individual's cancer risk and medical management are not determined by genetic test results alone. Overall cancer risk assessment incorporates additional factors, including personal medical history, family history, and any available genetic information that may result in a personalized plan for cancer prevention and surveillance  RECOMMENDATIONS FOR FAMILY MEMBERS:  Individuals in this family might be at some increased risk of developing cancer, over the general population risk, simply due to the family history of cancer.  We recommended women in this family have a yearly mammogram beginning at age 104, or 37 years younger than the earliest onset of cancer, an annual clinical breast exam, and perform monthly breast self-exams. Women in this  family should also have a gynecological exam as recommended by their primary provider. All family members should be referred for colonoscopy starting at age 42.  FOLLOW-UP: Lastly, we discussed with Anne Obrien that cancer genetics is a rapidly advancing field and it is possible that new genetic tests will be appropriate for her and/or her family members in the future. We encouraged her to remain in contact with cancer genetics on an annual basis so we can update her personal and family histories and let her know of advances in cancer genetics that may benefit  this family.   Our contact number was provided. Anne Obrien questions were answered to her satisfaction, and she knows she is welcome to call us at anytime with additional questions or concerns.   Roma Kayser, [place], [facility] Licensed, Certified Genetic Counselor [email or web address]

## 2020-03-06 ENCOUNTER — Other Ambulatory Visit: Payer: Self-pay | Admitting: Family Medicine

## 2020-03-06 DIAGNOSIS — Z1231 Encounter for screening mammogram for malignant neoplasm of breast: Secondary | ICD-10-CM

## 2020-04-18 ENCOUNTER — Ambulatory Visit: Payer: Medicare Other

## 2020-05-25 ENCOUNTER — Ambulatory Visit
Admission: RE | Admit: 2020-05-25 | Discharge: 2020-05-25 | Disposition: A | Discharge status: Home / Self Care | Payer: Medicare Other | Source: Ambulatory Visit | Attending: Family Medicine | Admitting: Family Medicine

## 2020-05-25 ENCOUNTER — Other Ambulatory Visit: Payer: Self-pay

## 2020-05-25 DIAGNOSIS — Z1231 Encounter for screening mammogram for malignant neoplasm of breast: Secondary | ICD-10-CM | POA: Diagnosis not present

## 2020-10-12 DIAGNOSIS — Z96641 Presence of right artificial hip joint: Secondary | ICD-10-CM | POA: Diagnosis not present

## 2020-10-31 DIAGNOSIS — R051 Acute cough: Secondary | ICD-10-CM | POA: Diagnosis not present

## 2021-01-04 DIAGNOSIS — H04123 Dry eye syndrome of bilateral lacrimal glands: Secondary | ICD-10-CM | POA: Diagnosis not present

## 2021-01-04 DIAGNOSIS — H16223 Keratoconjunctivitis sicca, not specified as Sjogren's, bilateral: Secondary | ICD-10-CM | POA: Diagnosis not present

## 2021-01-04 DIAGNOSIS — H26491 Other secondary cataract, right eye: Secondary | ICD-10-CM | POA: Diagnosis not present

## 2021-01-04 DIAGNOSIS — H40013 Open angle with borderline findings, low risk, bilateral: Secondary | ICD-10-CM | POA: Diagnosis not present

## 2021-03-22 DIAGNOSIS — J4520 Mild intermittent asthma, uncomplicated: Secondary | ICD-10-CM | POA: Diagnosis not present

## 2021-03-22 DIAGNOSIS — Z0000 Encounter for general adult medical examination without abnormal findings: Secondary | ICD-10-CM | POA: Diagnosis not present

## 2021-03-22 DIAGNOSIS — R7303 Prediabetes: Secondary | ICD-10-CM | POA: Diagnosis not present

## 2021-03-22 DIAGNOSIS — E7800 Pure hypercholesterolemia, unspecified: Secondary | ICD-10-CM | POA: Diagnosis not present

## 2021-04-10 ENCOUNTER — Other Ambulatory Visit: Payer: Self-pay | Admitting: Family Medicine

## 2021-04-10 DIAGNOSIS — M8580 Other specified disorders of bone density and structure, unspecified site: Secondary | ICD-10-CM

## 2021-04-11 ENCOUNTER — Other Ambulatory Visit: Payer: Self-pay | Admitting: Family Medicine

## 2021-04-11 DIAGNOSIS — Z1231 Encounter for screening mammogram for malignant neoplasm of breast: Secondary | ICD-10-CM

## 2021-05-23 DIAGNOSIS — M25551 Pain in right hip: Secondary | ICD-10-CM | POA: Diagnosis not present

## 2021-05-28 ENCOUNTER — Ambulatory Visit: Payer: Medicare Other

## 2021-06-01 ENCOUNTER — Ambulatory Visit
Admission: RE | Admit: 2021-06-01 | Discharge: 2021-06-01 | Disposition: A | Discharge status: Home / Self Care | Payer: Medicare Other | Source: Ambulatory Visit | Attending: Family Medicine | Admitting: Family Medicine

## 2021-06-01 DIAGNOSIS — Z1231 Encounter for screening mammogram for malignant neoplasm of breast: Secondary | ICD-10-CM

## 2021-06-07 ENCOUNTER — Other Ambulatory Visit: Payer: Self-pay | Admitting: Family Medicine

## 2021-06-07 DIAGNOSIS — N6320 Unspecified lump in the left breast, unspecified quadrant: Secondary | ICD-10-CM

## 2021-06-27 ENCOUNTER — Ambulatory Visit
Admission: RE | Admit: 2021-06-27 | Discharge: 2021-06-27 | Disposition: A | Discharge status: Home / Self Care | Payer: Medicare Other | Source: Ambulatory Visit | Attending: Family Medicine | Admitting: Family Medicine

## 2021-06-27 DIAGNOSIS — N6320 Unspecified lump in the left breast, unspecified quadrant: Secondary | ICD-10-CM

## 2021-06-27 DIAGNOSIS — R922 Inconclusive mammogram: Secondary | ICD-10-CM | POA: Diagnosis not present

## 2021-07-27 ENCOUNTER — Other Ambulatory Visit: Payer: Self-pay | Admitting: Nurse Practitioner

## 2021-08-29 ENCOUNTER — Ambulatory Visit
Admission: RE | Admit: 2021-08-29 | Discharge: 2021-08-29 | Disposition: A | Discharge status: Home / Self Care | Payer: Medicare Other | Source: Ambulatory Visit | Attending: Family Medicine | Admitting: Family Medicine

## 2021-08-29 DIAGNOSIS — M8580 Other specified disorders of bone density and structure, unspecified site: Secondary | ICD-10-CM

## 2021-10-05 DIAGNOSIS — J209 Acute bronchitis, unspecified: Secondary | ICD-10-CM | POA: Diagnosis not present

## 2021-12-14 ENCOUNTER — Ambulatory Visit
Admission: RE | Admit: 2021-12-14 | Discharge: 2021-12-14 | Disposition: A | Discharge status: Home / Self Care | Payer: Medicare Other | Source: Ambulatory Visit | Attending: Family Medicine | Admitting: Family Medicine

## 2021-12-14 DIAGNOSIS — Z780 Asymptomatic menopausal state: Secondary | ICD-10-CM | POA: Diagnosis not present

## 2021-12-14 DIAGNOSIS — M8589 Other specified disorders of bone density and structure, multiple sites: Secondary | ICD-10-CM | POA: Diagnosis not present

## 2022-01-10 DIAGNOSIS — H26491 Other secondary cataract, right eye: Secondary | ICD-10-CM | POA: Diagnosis not present

## 2022-01-10 DIAGNOSIS — H524 Presbyopia: Secondary | ICD-10-CM | POA: Diagnosis not present

## 2022-01-10 DIAGNOSIS — H04123 Dry eye syndrome of bilateral lacrimal glands: Secondary | ICD-10-CM | POA: Diagnosis not present

## 2022-01-10 DIAGNOSIS — H40013 Open angle with borderline findings, low risk, bilateral: Secondary | ICD-10-CM | POA: Diagnosis not present

## 2022-02-20 DIAGNOSIS — D225 Melanocytic nevi of trunk: Secondary | ICD-10-CM | POA: Diagnosis not present

## 2022-02-20 DIAGNOSIS — D2271 Melanocytic nevi of right lower limb, including hip: Secondary | ICD-10-CM | POA: Diagnosis not present

## 2022-02-20 DIAGNOSIS — L570 Actinic keratosis: Secondary | ICD-10-CM | POA: Diagnosis not present

## 2022-02-20 DIAGNOSIS — D2262 Melanocytic nevi of left upper limb, including shoulder: Secondary | ICD-10-CM | POA: Diagnosis not present

## 2022-02-20 DIAGNOSIS — D2261 Melanocytic nevi of right upper limb, including shoulder: Secondary | ICD-10-CM | POA: Diagnosis not present

## 2022-02-20 DIAGNOSIS — D2272 Melanocytic nevi of left lower limb, including hip: Secondary | ICD-10-CM | POA: Diagnosis not present

## 2022-02-20 DIAGNOSIS — L578 Other skin changes due to chronic exposure to nonionizing radiation: Secondary | ICD-10-CM | POA: Diagnosis not present

## 2022-02-20 DIAGNOSIS — L821 Other seborrheic keratosis: Secondary | ICD-10-CM | POA: Diagnosis not present

## 2022-03-18 DIAGNOSIS — R059 Cough, unspecified: Secondary | ICD-10-CM | POA: Diagnosis not present

## 2022-03-18 DIAGNOSIS — R062 Wheezing: Secondary | ICD-10-CM | POA: Diagnosis not present

## 2022-04-10 DIAGNOSIS — M8588 Other specified disorders of bone density and structure, other site: Secondary | ICD-10-CM | POA: Diagnosis not present

## 2022-04-10 DIAGNOSIS — R251 Tremor, unspecified: Secondary | ICD-10-CM | POA: Diagnosis not present

## 2022-04-10 DIAGNOSIS — R7303 Prediabetes: Secondary | ICD-10-CM | POA: Diagnosis not present

## 2022-04-10 DIAGNOSIS — Z0000 Encounter for general adult medical examination without abnormal findings: Secondary | ICD-10-CM | POA: Diagnosis not present

## 2022-04-10 DIAGNOSIS — E7800 Pure hypercholesterolemia, unspecified: Secondary | ICD-10-CM | POA: Diagnosis not present

## 2022-04-10 DIAGNOSIS — J4520 Mild intermittent asthma, uncomplicated: Secondary | ICD-10-CM | POA: Diagnosis not present

## 2022-04-10 DIAGNOSIS — Z853 Personal history of malignant neoplasm of breast: Secondary | ICD-10-CM | POA: Diagnosis not present

## 2022-05-23 DIAGNOSIS — L570 Actinic keratosis: Secondary | ICD-10-CM | POA: Diagnosis not present

## 2022-05-28 ENCOUNTER — Other Ambulatory Visit: Payer: Self-pay | Admitting: Family Medicine

## 2022-05-28 DIAGNOSIS — Z1231 Encounter for screening mammogram for malignant neoplasm of breast: Secondary | ICD-10-CM

## 2022-05-29 ENCOUNTER — Ambulatory Visit: Payer: Medicare Other | Admitting: Diagnostic Neuroimaging

## 2022-05-29 ENCOUNTER — Telehealth: Payer: Self-pay | Admitting: Diagnostic Neuroimaging

## 2022-05-29 ENCOUNTER — Encounter: Payer: Self-pay | Admitting: Diagnostic Neuroimaging

## 2022-05-29 VITALS — BP 137/81 | HR 80 | Ht 61.0 in | Wt 154.0 lb

## 2022-05-29 DIAGNOSIS — G20C Parkinsonism, unspecified: Secondary | ICD-10-CM

## 2022-05-29 MED ORDER — CARBIDOPA-LEVODOPA 25-100 MG PO TABS: 1.0000 | ORAL_TABLET | Freq: Three times a day (TID) | ORAL | 6 refills | Status: DC

## 2022-05-29 NOTE — Telephone Encounter (Signed)
[employer] medicare NPR sent to GI [phone]

## 2022-05-29 NOTE — Progress Notes (Signed)
[facility]  PATIENT: Anne Obrien DOB: May 11, 1940  REFERRING CLINICIAN: Marda Stalker, PA-C HISTORY FROM: PATIENT, daughter REASON FOR VISIT: NEW CONSULT   HISTORICAL  CHIEF COMPLAINT:  Chief Complaint  Patient presents with   New Patient (Initial Visit)    Pt alone, rm 6. She has noted that her left hand has developed a tremor. It was initially in the thumb when it started couple years ago. It has progressed into the hand this year and that is why she has wanted to be evaluated. Occurs daily    HISTORY OF PRESENT ILLNESS:   82 year old female here for evaluation of tremor.  For past 2 years has noticed some tremor in left thumb.  She also has very minor tremor in his right thumb.  Has had some issues with decreased sleep and anxiety.  Also having worsening gait and balance.  Patient lives at home and is primary caregiver for her husband who has dementia.  They have a daughter also stays with them.  A different daughter is here for this visit today.  This daughter is a Marine scientist at the emergency room.   REVIEW OF SYSTEMS: Full 14 system review of systems performed and negative with exception of: as per HPI.   ALLERGIES: Allergies  Allergen Reactions   Augmentin [Amoxicillin-Pot Clavulanate] Diarrhea    HOME MEDICATIONS: Outpatient Medications Prior to Visit  Medication Sig Dispense Refill   acetaminophen (TYLENOL) 325 MG tablet Take 650 mg by mouth every 6 (six) hours as needed for moderate pain.     Ascorbic Acid (VITAMIN C) 500 MG CAPS Take 500 mg by mouth daily.     b complex vitamins capsule Take 1 capsule by mouth daily.     Cholecalciferol (VITAMIN D3) 125 MCG (5000 UT) TABS Take 1 tablet by mouth daily.     Glucosamine 500 MG CAPS Take 500 mg by mouth in the morning, at noon, and at bedtime.     Magnesium 250 MG TABS Take 250 mg by mouth daily.     Omega-3 Fatty Acids (FISH OIL) 1000 MG CAPS Take 1,000 mg by mouth daily.     Zinc 100 MG  TABS Take 100 mg by mouth daily.     methocarbamol (ROBAXIN) 500 MG tablet Take 1 tablet (500 mg total) by mouth every 6 (six) hours as needed for muscle spasms. 80 tablet 0   oxyCODONE (OXY IR/ROXICODONE) 5 MG immediate release tablet Take 1-2 tablets (5-10 mg total) by mouth every 3 (three) hours as needed for moderate pain or severe pain. 80 tablet 0   rivaroxaban (XARELTO) 10 MG TABS tablet Take 1 tablet (10 mg total) by mouth daily with breakfast. Take Xarelto for two and a half more weeks, then discontinue Xarelto. Once the patient has completed the Xarelto, they may resume the 81 mg Aspirin. 19 tablet 0   tamoxifen (NOLVADEX) 20 MG tablet Take 1 tablet (20 mg total) by mouth daily. (Patient taking differently: Take 20 mg by mouth at bedtime. ) 90 tablet 4   traMADol (ULTRAM) 50 MG tablet Take 1-2 tablets (50-100 mg total) by mouth every 6 (six) hours as needed (mild pain). 80 tablet 1   No facility-administered medications prior to visit.    PAST MEDICAL HISTORY: Past Medical History:  Diagnosis Date   Arthritis    Breast cancer ([place])    stage I left   Bundle branch block    Bursitis    Cancer ([place])    face  Chronic cystitis    Chronic pain    Coronary artery disease    Degenerative disc disease, lumbar    Family history of adverse reaction to anesthesia    pt states her son has to have large amount of anesthesia - difficulty to go under    Family history of breast cancer    Family history of pancreatic cancer 12/08/2019   GERD (gastroesophageal reflux disease)    Hemorrhoids    Hepatitis A 1976   History of measles    History of mumps    Malaria    Malaria    Menopause    Nocturia    Osteopenia    Osteoporosis    Personal history of malignant neoplasm of breast    PONV (postoperative nausea and vomiting)    Urinary tract infection    Varicose veins     PAST SURGICAL HISTORY: Past Surgical History:  Procedure Laterality Date   BARIATRIC SURGERY N/A    BREAST  LUMPECTOMY  02/2010   left lumpectomy and sentinel node   SKIN CANCER DESTRUCTION     Nose cryotherapy   TOTAL HIP ARTHROPLASTY Right 06/14/2015   Procedure: RIGHT TOTAL HIP ARTHROPLASTY ANTERIOR APPROACH;  Surgeon: Gaynelle Arabian, MD;  Location: [facility];  Service: Orthopedics;  Laterality: Right;   VAGINAL HYSTERECTOMY  may 2008   and vaginal repair    FAMILY HISTORY: Family History  Problem Relation Age of Onset   Breast cancer Mother 34   Arthritis Mother    Pernicious anemia Father    Arthritis Sister    Arthritis Brother    Aneurysm Brother    Breast cancer Maternal Grandmother 63   Breast cancer Maternal Aunt 55   Cancer Maternal Uncle        NOS   Diabetes Paternal Uncle    Stroke Paternal Grandfather    Aneurysm Maternal Aunt    Breast cancer Cousin        mat first cousin   Ovarian cancer Cousin        mat first cousin   Cancer Maternal Uncle        blood cancer    SOCIAL HISTORY: Social History   Socioeconomic History   Marital status: Married    Spouse name: Not on file   Number of children: Not on file   Years of education: Not on file   Highest education level: Not on file  Occupational History   Not on file  Tobacco Use   Smoking status: Never   Smokeless tobacco: Never  Substance and Sexual Activity   Alcohol use: No   Drug use: No   Sexual activity: Yes    Birth control/protection: Post-menopausal, Surgical  Other Topics Concern   Not on file  Social History Narrative   Not on file   Social Determinants of Health   Financial Resource Strain: Not on file  Food Insecurity: Not on file  Transportation Needs: Not on file  Physical Activity: Not on file  Stress: Not on file  Social Connections: Not on file  Intimate Partner Violence: Not on file     PHYSICAL EXAM  GENERAL EXAM/CONSTITUTIONAL: Vitals:  Vitals:   05/29/22 0924  BP: 137/81  Pulse: 80  Weight: 154 lb (69.9 kg)  Height: 5' 1"$  (1.549 m)   Body mass index is 29.1  kg/m. Wt Readings from Last 3 Encounters:  05/29/22 154 lb (69.9 kg)  06/14/15 151 lb (68.5 kg)  06/07/15 151 lb 4.8 oz (68.6  kg)   Patient is in no distress; well developed, nourished and groomed; neck is supple  CARDIOVASCULAR: Examination of carotid arteries is normal; no carotid bruits Regular rate and rhythm, no murmurs Examination of peripheral vascular system by observation and palpation is normal  EYES: Ophthalmoscopic exam of optic discs and posterior segments is normal; no papilledema or hemorrhages No results found.  MUSCULOSKELETAL: Gait, strength, tone, movements noted in Neurologic exam below  NEUROLOGIC: MENTAL STATUS:      No data to display         awake, alert, oriented to person, place and time recent and remote memory intact normal attention and concentration language fluent, comprehension intact, naming intact fund of knowledge appropriate  CRANIAL NERVE:  2nd - no papilledema on fundoscopic exam 2nd, 3rd, 4th, 6th - pupils equal and reactive to light, visual fields full to confrontation, extraocular muscles intact, no nystagmus 5th - facial sensation symmetric 7th - facial strength symmetric 8th - hearing intact 9th - palate elevates symmetrically, uvula midline 11th - shoulder shrug symmetric 12th - tongue protrusion midline  MOTOR:  RESTING TREMOR IN L > R THUMB MILD POSTURAL TREMOR IN BUE MILD COGWHEELING IN LUE MILD BRADYKINESIA IN LUE normal bulk and tone, full strength in the BUE, BLE  SENSORY:  normal and symmetric to light touch, pinprick, temperature, vibration  COORDINATION:  finger-nose-finger, fine finger movements normal  REFLEXES:  deep tendon reflexes 1+ and symmetric BORDERLINE SNOUT REFLEX  GAIT/STATION:  narrow based gait; BORDERLING PULL BACK TESTING     DIAGNOSTIC DATA (LABS, IMAGING, TESTING) - I reviewed patient records, labs, notes, testing and imaging myself where available.  Lab Results  Component  Value Date   WBC 19.9 (H) 06/16/2015   HGB 10.4 (L) 06/16/2015   HCT 31.6 (L) 06/16/2015   MCV 89.3 06/16/2015   PLT 165 06/16/2015      Component Value Date/Time   NA 144 06/16/2015 0339   NA 140 06/07/2015 1414   K 4.7 06/16/2015 0339   K 4.2 06/07/2015 1414   CL 109 06/16/2015 0339   CL 107 09/15/2012 1236   CO2 28 06/16/2015 0339   CO2 27 06/07/2015 1414   GLUCOSE 135 (H) 06/16/2015 0339   GLUCOSE 94 06/07/2015 1414   GLUCOSE 117 (H) 09/15/2012 1236   BUN 13 06/16/2015 0339   BUN 18.5 06/07/2015 1414   CREATININE 0.55 06/16/2015 0339   CREATININE 1.1 06/07/2015 1414   CALCIUM 9.1 06/16/2015 0339   CALCIUM 8.8 06/07/2015 1414   PROT 6.4 06/07/2015 1414   ALBUMIN 3.5 06/07/2015 1414   AST 16 06/07/2015 1414   ALT <9 06/07/2015 1414   ALKPHOS 54 06/07/2015 1414   BILITOT 0.35 06/07/2015 1414   GFRNONAA >60 06/16/2015 0339   GFRAA >60 06/16/2015 0339   No results found for: "CHOL", "HDL", "LDLCALC", "LDLDIRECT", "TRIG", "CHOLHDL" No results found for: "HGBA1C" No results found for: "VITAMINB12" No results found for: "TSH"    ASSESSMENT AND PLAN  82 y.o. year old female here with gradual progressive resting tremor, bradykinesia, cogwheel rigidity, gait difficulty, signs symptoms most consistent with idiopathic Parkinson disease.   Dx:  1. Parkinsonism, unspecified Parkinsonism type       PLAN:  PARKINSONISM - check MRI brain (rule out other causes) - refer to PT (for gait training) - trial of carbidopa / levodopa (25/100) half tab three times a day with meals x 1-2 weeks; then 1 tab three times a day with meals  Orders Placed This  Encounter  Procedures   MR BRAIN W WO CONTRAST   Ambulatory referral to Physical Therapy   Meds ordered this encounter  Medications   carbidopa-levodopa (SINEMET IR) 25-100 MG tablet    Sig: Take 1 tablet by mouth 3 (three) times daily before meals.    Dispense:  90 tablet    Refill:  6   Return in about 4 months  (around 09/27/2022).    Penni Bombard, MD [id number], [id number] AM Certified in Neurology, Neurophysiology and Neuroimaging  [facility] [address] [place], [place] [address] [phone]

## 2022-05-29 NOTE — Patient Instructions (Signed)
-   check MRI brain  - refer to PT  - start carbidopa / levodopa (25/100) half tab three times a day with meals x 1-2 weeks; then 1 tab three times a day with meals

## 2022-06-05 ENCOUNTER — Encounter: Payer: Self-pay | Admitting: Physical Therapy

## 2022-06-05 ENCOUNTER — Ambulatory Visit: Payer: Medicare Other | Attending: Diagnostic Neuroimaging | Admitting: Physical Therapy

## 2022-06-05 ENCOUNTER — Other Ambulatory Visit: Payer: Self-pay

## 2022-06-05 DIAGNOSIS — R2681 Unsteadiness on feet: Secondary | ICD-10-CM | POA: Diagnosis not present

## 2022-06-05 DIAGNOSIS — R29818 Other symptoms and signs involving the nervous system: Secondary | ICD-10-CM | POA: Diagnosis not present

## 2022-06-05 DIAGNOSIS — R293 Abnormal posture: Secondary | ICD-10-CM | POA: Insufficient documentation

## 2022-06-05 DIAGNOSIS — G20C Parkinsonism, unspecified: Secondary | ICD-10-CM | POA: Diagnosis not present

## 2022-06-05 DIAGNOSIS — R2689 Other abnormalities of gait and mobility: Secondary | ICD-10-CM | POA: Diagnosis not present

## 2022-06-05 DIAGNOSIS — M6281 Muscle weakness (generalized): Secondary | ICD-10-CM | POA: Insufficient documentation

## 2022-06-05 NOTE — Therapy (Signed)
OUTPATIENT PHYSICAL THERAPY NEURO EVALUATION   Patient Name: Anne Obrien MRN: [id number] DOB:28-Aug-1940, 82 y.o., female Today's Date: 06/06/2022   PCP: Kathyrn Lass MD REFERRING PROVIDER: Penni Bombard, MD   END OF SESSION:  PT End of Session - 06/05/22 0847     Visit Number 1    Number of Visits 17    Date for PT Re-Evaluation 08/02/22    Authorization Type [employer] Medicare    PT Start Time 0847    PT Stop Time 0933    PT Time Calculation (min) 46 min    Activity Tolerance Patient tolerated treatment well    Behavior During Therapy WFL for tasks assessed/performed             Past Medical History:  Diagnosis Date   Arthritis    Breast cancer ([place])    stage I left   Bundle branch block    Bursitis    Cancer ([place])    face   Chronic cystitis    Chronic pain    Coronary artery disease    Degenerative disc disease, lumbar    Family history of adverse reaction to anesthesia    pt states her son has to have large amount of anesthesia - difficulty to go under    Family history of breast cancer    Family history of pancreatic cancer 12/08/2019   GERD (gastroesophageal reflux disease)    Hemorrhoids    Hepatitis A 1976   History of measles    History of mumps    Malaria    Malaria    Menopause    Nocturia    Osteopenia    Osteoporosis    Personal history of malignant neoplasm of breast    PONV (postoperative nausea and vomiting)    Urinary tract infection    Varicose veins    Past Surgical History:  Procedure Laterality Date   BARIATRIC SURGERY N/A    BREAST LUMPECTOMY  02/2010   left lumpectomy and sentinel node   SKIN CANCER DESTRUCTION     Nose cryotherapy   TOTAL HIP ARTHROPLASTY Right 06/14/2015   Procedure: RIGHT TOTAL HIP ARTHROPLASTY ANTERIOR APPROACH;  Surgeon: Gaynelle Arabian, MD;  Location: [facility];  Service: Orthopedics;  Laterality: Right;   VAGINAL HYSTERECTOMY  may 2008   and vaginal repair   Patient Active Problem List   Diagnosis  Date Noted   Genetic testing 01/10/2020   Family history of pancreatic cancer 12/08/2019   Family history of breast cancer    Personal history of malignant neoplasm of breast    OA (osteoarthritis) of hip 06/14/2015   Preoperative cardiovascular examination 04/18/2015   Degenerative joint disease (DJD) of hip 04/18/2015   Abnormal EKG 04/18/2015   Incomplete RBBB 04/18/2015   Osteopenia 06/21/2014   Nausea and vomiting 01/23/2013   UTI (urinary tract infection) 01/23/2013   Chest pain 01/22/2013   Breast cancer, left breast ([place]) 01/14/2011    ONSET DATE: 05/29/2022 (MD referral)  REFERRING DIAG: G20.C (ICD-10-CM) - Parkinsonism, unspecified Parkinsonism type  THERAPY DIAG:  Unsteadiness on feet  Other abnormalities of gait and mobility  Abnormal posture  Muscle weakness (generalized)  Other symptoms and signs involving the nervous system  Rationale for Evaluation and Treatment: Rehabilitation  SUBJECTIVE:  SUBJECTIVE STATEMENT: Pt reports she started Sinemet last week after Dr. Gladstone Lighter visit, and has had less tremor since the starting of medication. Daughter notes balance difficulty.  Usually drift to the left.  Tremor has been going on for 2 years. Pt accompanied by: family member  PERTINENT HISTORY: Arthritis, breast Ca, CAD, lumbar DDD, GERD, osteoporosis, R THA (anterior) 2017  PAIN:  Are you having pain? Yes: NPRS scale: unsure how to rate/10 Pain location: legs Pain description: leg cramps Aggravating factors: worst in bed Relieving factors: standing up, getting up to move  Typical arthritis pain in back PRECAUTIONS: Fall  WEIGHT BEARING RESTRICTIONS: No  FALLS: Has patient fallen in last 6 months? Yes. Number of falls at least 2 No injuries, able to get up from the  floor.  LIVING ENVIRONMENT: Lives with: lives with their spouse Lives in: House/apartment Stairs: Yes: External: 3 steps; on right going up and on left going up Has following equipment at home: Single point cane and None Cares for husband with dementia  PLOF: Independent Does ROM exercises for legs at home and previous ex from hip surgery.  Enjoys gardening.  PATIENT GOALS: Goals are for balance  OBJECTIVE:   DIAGNOSTIC FINDINGS: MRI scheduled in March  COGNITION: Overall cognitive status: Within functional limits for tasks assessed   SENSATION: Light touch: WFL   POSTURE: rounded shoulders  LOWER EXTREMITY ROM:   WFL   LOWER EXTREMITY MMT:    MMT Right Eval Left Eval  Hip flexion 4 4  Hip extension    Hip abduction    Hip adduction    Hip internal rotation    Hip external rotation    Knee flexion 4 4  Knee extension 4 4  Ankle dorsiflexion 3+ 4  Ankle plantarflexion    Ankle inversion    Ankle eversion    (Blank rows = not tested)    TRANSFERS: Assistive device utilized: None  Sit to stand: SBA Stand to sit: SBA   S GAIT: Gait pattern: step through pattern, decreased step length- Right, decreased step length- Left, and narrow BOS Distance walked: 50 ft x 2 Assistive device utilized: None Level of assistance: Modified independence and SBA  FUNCTIONAL TESTS:  5 times sit to stand: 9.97 sec with posterior lean Timed up and go (TUG): 12.43 sec 10 meter walk test: 10.72 sec (3.06 ft/sec) TUG cognitive: 13.97 sec MiniBEST: 20/28 (scores <20/28 indicate increased fall risk TUG manual:  11.97 sec 360 degree turn to R:  9 steps, to L:  9 10 M fast:  8.25 sec (3.98 ft/sec) 53M back:  10.12 sec (0.99 ft/sec)  [facility] PT Assessment - 06/06/22 0001       Standardized Balance Assessment   Standardized Balance Assessment Mini-BESTest      Mini-BESTest   Sit To Stand Normal: Comes to stand without use of hands and stabilizes independently.    Rise to  Toes < 3 s.   1.57 sec   Stand on one leg (left) Moderate: < 20 s   1.66, 5.12   Stand on one leg (right) Moderate: < 20 s   1.07, 1.35   Stand on one leg - lowest score 1    Compensatory Stepping Correction - Forward Normal: Recovers independently with a single, large step (second realignement is allowed).    Compensatory Stepping Correction - Backward Moderate: More than one step is required to recover equilibrium    Compensatory Stepping Correction - Left Lateral Normal: Recovers independently with 1 step (  crossover or lateral OK)    Compensatory Stepping Correction - Right Lateral Normal: Recovers independently with 1 step (crossover or lateral OK)    Stepping Corredtion Lateral - lowest score 2    Stance - Feet together, eyes open, firm surface  Normal: 30s    Stance - Feet together, eyes closed, foam surface  Moderate: < 30s   2.56   Incline - Eyes Closed Normal: Stands independently 30s and aligns with gravity    Change in Gait Speed Normal: Significantly changes walkling speed without imbalance    Walk with head turns - Horizontal Moderate: performs head turns with reduction in gait speed.    Walk with pivot turns Normal: Turns with feet close FAST (< 3 steps) with good balance.    Step over obstacles Moderate: Steps over box but touches box OR displays cautious behavior by slowing gait.    Timed UP & GO with Dual Task Moderate: Dual Task affects either counting OR walking (>10%) when compared to the TUG without Dual Task.    Mini-BEST total score 20              TODAY'S TREATMENT:                                                                                                                              DATE: 06/05/2022    PATIENT EDUCATION: Education details: Pt eval results, POC Person educated: Patient and Child(ren) Education method: Explanation Education comprehension: verbalized understanding  HOME EXERCISE PROGRAM: Not yet initiated  GOALS: Goals reviewed with  patient? Yes  SHORT TERM GOALS: Target date: 07/05/2022  Pt will be independent with HEP for improved balance, transfers, gait. Baseline: Goal status: INITIAL  2. Pt/family will verbalize understanding of local community PD resources, including community fitness. Baseline:  Goal status: INITIAL  LONG TERM GOALS: Target date: 08/02/2022  Pt will be independent with HEP for improved balance, transfers, gait. Baseline:  Goal status: INITIAL  2.  Pt will improve TUG/TUG cognitive score to less than or equal to 10% difference for decreased fall risk.  Baseline: 12.43 sec/13.97 sec Goal status: INITIAL  3.  Pt will improve MiniBESTest score to at least 23/28 to decrease fall risk. Baseline: 20/28 Goal status: INITIAL  ASSESSMENT:  CLINICAL IMPRESSION: Patient is a 82 y.o. female who was seen today for physical therapy evaluation and treatment for new diagnosis of Parkinson's disease. Daughter present and reports overall they want to work on balance and exercise to keep patient moving well.  She presents with decreased balance, decreased timing and coordination of gait, decreased strength, decreased dual tasking.  She has history of falls and she demonstrates fall risk per MiniBESTest score.  She is active as caregiver for husband.  She will benefit from skilled PT to address the above stated deficits for decreased fall risk and overall improved functional mobility.  OBJECTIVE IMPAIRMENTS: Abnormal gait, decreased balance, decreased mobility, difficulty walking, decreased strength, and  postural dysfunction.   ACTIVITY LIMITATIONS: standing, transfers, locomotion level, and caring for others  PARTICIPATION LIMITATIONS: community activity  PERSONAL FACTORS: Time since onset of injury/illness/exacerbation and 3+ comorbidities: recent dx of PD; see PMH above  are also affecting patient's functional outcome.   REHAB POTENTIAL: Good  CLINICAL DECISION MAKING: Evolving/moderate  complexity  EVALUATION COMPLEXITY: Moderate  PLAN:  PT FREQUENCY: 2x/week  PT DURATION: 8 weeks plus eval  PLANNED INTERVENTIONS: Therapeutic exercises, Therapeutic activity, Neuromuscular re-education, Balance training, Gait training, Patient/Family education, and Self Care  PLAN FOR NEXT SESSION: Initiate HEP-PWR! Moves sitting, standing; functional strength and balance work.  Discussion on community fitness options.   Frazier Butt., PT 06/06/2022, 3:15 PM  [facility] [address], [place], [place] [address] Phone # [phone] Fax # [phone]

## 2022-06-13 ENCOUNTER — Ambulatory Visit: Payer: Medicare Other | Attending: Diagnostic Neuroimaging | Admitting: Physical Therapy

## 2022-06-13 ENCOUNTER — Encounter: Payer: Self-pay | Admitting: Physical Therapy

## 2022-06-13 DIAGNOSIS — R293 Abnormal posture: Secondary | ICD-10-CM | POA: Diagnosis not present

## 2022-06-13 DIAGNOSIS — M6281 Muscle weakness (generalized): Secondary | ICD-10-CM | POA: Insufficient documentation

## 2022-06-13 DIAGNOSIS — R2689 Other abnormalities of gait and mobility: Secondary | ICD-10-CM | POA: Diagnosis not present

## 2022-06-13 DIAGNOSIS — R2681 Unsteadiness on feet: Secondary | ICD-10-CM | POA: Diagnosis not present

## 2022-06-13 NOTE — Therapy (Signed)
OUTPATIENT PHYSICAL THERAPY NEURO TREATMENT   Patient Name: Anne Obrien MRN: [id number] DOB:1940-07-27, 82 y.o., female Today's Date: 06/13/2022   PCP: Kathyrn Lass MD REFERRING PROVIDER: Penni Bombard, MD   END OF SESSION:  PT End of Session - 06/13/22 0932     Visit Number 2    Number of Visits 17    Date for PT Re-Evaluation 08/02/22    Authorization Type UHC Medicare    PT Start Time 0933    PT Stop Time 1011    PT Time Calculation (min) 38 min    Equipment Utilized During Treatment Gait belt    Activity Tolerance Patient tolerated treatment well    Behavior During Therapy WFL for tasks assessed/performed              Past Medical History:  Diagnosis Date   Arthritis    Breast cancer ([place])    stage I left   Bundle branch block    Bursitis    Cancer ([place])    face   Chronic cystitis    Chronic pain    Coronary artery disease    Degenerative disc disease, lumbar    Family history of adverse reaction to anesthesia    pt states her son has to have large amount of anesthesia - difficulty to go under    Family history of breast cancer    Family history of pancreatic cancer 12/08/2019   GERD (gastroesophageal reflux disease)    Hemorrhoids    Hepatitis A 1976   History of measles    History of mumps    Malaria    Malaria    Menopause    Nocturia    Osteopenia    Osteoporosis    Personal history of malignant neoplasm of breast    PONV (postoperative nausea and vomiting)    Urinary tract infection    Varicose veins    Past Surgical History:  Procedure Laterality Date   BARIATRIC SURGERY N/A    BREAST LUMPECTOMY  02/2010   left lumpectomy and sentinel node   SKIN CANCER DESTRUCTION     Nose cryotherapy   TOTAL HIP ARTHROPLASTY Right 06/14/2015   Procedure: RIGHT TOTAL HIP ARTHROPLASTY ANTERIOR APPROACH;  Surgeon: Gaynelle Arabian, MD;  Location: [facility];  Service: Orthopedics;  Laterality: Right;   VAGINAL HYSTERECTOMY  may 2008   and vaginal  repair   Patient Active Problem List   Diagnosis Date Noted   Genetic testing 01/10/2020   Family history of pancreatic cancer 12/08/2019   Family history of breast cancer    Personal history of malignant neoplasm of breast    OA (osteoarthritis) of hip 06/14/2015   Preoperative cardiovascular examination 04/18/2015   Degenerative joint disease (DJD) of hip 04/18/2015   Abnormal EKG 04/18/2015   Incomplete RBBB 04/18/2015   Osteopenia 06/21/2014   Nausea and vomiting 01/23/2013   UTI (urinary tract infection) 01/23/2013   Chest pain 01/22/2013   Breast cancer, left breast ([place]) 01/14/2011    ONSET DATE: 05/29/2022 (MD referral)  REFERRING DIAG: G20.C (ICD-10-CM) - Parkinsonism, unspecified Parkinsonism type  THERAPY DIAG:  Unsteadiness on feet  Other abnormalities of gait and mobility  Abnormal posture  Muscle weakness (generalized)  Rationale for Evaluation and Treatment: Rehabilitation  SUBJECTIVE:  SUBJECTIVE STATEMENT: Went to the symposium.  It was a good thing.   Pt accompanied by: family member  PERTINENT HISTORY: Arthritis, breast Ca, CAD, lumbar DDD, GERD, osteoporosis, R THA (anterior) 2017  PAIN:  Are you having pain? Yes: NPRS scale: No pain today/10 Pain location: legs Pain description: leg cramps Aggravating factors: worst in bed Relieving factors: standing up, getting up to move  Typical arthritis pain in back PRECAUTIONS: Fall  WEIGHT BEARING RESTRICTIONS: No  FALLS: Has patient fallen in last 6 months? Yes. Number of falls at least 2 No injuries, able to get up from the floor.  LIVING ENVIRONMENT: Lives with: lives with their spouse Lives in: House/apartment Stairs: Yes: External: 3 steps; on right going up and on left going up Has following equipment at  home: Single point cane and None Cares for husband with dementia  PLOF: Independent Does ROM exercises for legs at home and previous ex from hip surgery.  Enjoys gardening.  PATIENT GOALS: Goals are for balance  OBJECTIVE:    TODAY'S TREATMENT: 06/13/2022 Activity Comments  NuStep, Level 4, 4 extremities x 5 minutes for aerobic activity Cues to keep SPM >90 for increased intensity  Sit<>stand from mat, 5 reps No UE support  Heel/toe raises 10 reps x 2   Hamstring curls, 2 x 10 reps   Marching in place 2 x 10 reps   Gait 50 ft x 4 reps cues for arm swing Improved step length and foot clearance  Forward step and weigthshift, over obstacle, x 10 reps 1 UE support   Pt performs PWR! Moves in sitting position x 10 reps, with initial 2-3 reps each position for prepare for stretch/warm-up   PWR! Up for improved posture  PWR! Rock for improved weighshifting  PWR! Twist for improved trunk rotation   PWR! Step for improved step initiation   Cues provided for technique and intensity  Performed additional 5 reps each exercise with handouts for preparation for HEP  Pt performs PWR! Moves in standing position x 10 reps   PWR! Up for improved posture  PWR! Rock for improved weighshifting  PWR! Twist for improved trunk rotation   PWR! Step for improved step initiation -step over obstacle, cues for brief pause in the middle  Cues provided for technique, intensity, posture/pause   PATIENT EDUCATION: Education details: HEP initiated-PWR! Moves sitting Person educated: Patient Education method: Explanation, Demonstration, Verbal cues, and Handouts Education comprehension: verbalized understanding, returned demonstration, verbal cues required, and needs further education    --------------------------------------------------------------------------- (Objective measures below taken at initial evaluation):  DIAGNOSTIC FINDINGS: MRI scheduled in March  COGNITION: Overall cognitive  status: Within functional limits for tasks assessed   SENSATION: Light touch: WFL   POSTURE: rounded shoulders  LOWER EXTREMITY ROM:   WFL   LOWER EXTREMITY MMT:    MMT Right Eval Left Eval  Hip flexion 4 4  Hip extension    Hip abduction    Hip adduction    Hip internal rotation    Hip external rotation    Knee flexion 4 4  Knee extension 4 4  Ankle dorsiflexion 3+ 4  Ankle plantarflexion    Ankle inversion    Ankle eversion    (Blank rows = not tested)    TRANSFERS: Assistive device utilized: None  Sit to stand: SBA Stand to sit: SBA   S GAIT: Gait pattern: step through pattern, decreased step length- Right, decreased step length- Left, and narrow BOS Distance walked: 50 ft x 2  Assistive device utilized: None Level of assistance: Modified independence and SBA  FUNCTIONAL TESTS:  5 times sit to stand: 9.97 sec with posterior lean Timed up and go (TUG): 12.43 sec 10 meter walk test: 10.72 sec (3.06 ft/sec) TUG cognitive: 13.97 sec MiniBEST: 20/28 (scores <20/28 indicate increased fall risk TUG manual:  11.97 sec 360 degree turn to R:  9 steps, to L:  9 10 M fast:  8.25 sec (3.98 ft/sec) 45M back:  10.12 sec (0.99 ft/sec)     TODAY'S TREATMENT:                                                                                                                              DATE: 06/05/2022    PATIENT EDUCATION: Education details: Pt eval results, POC Person educated: Patient and Child(ren) Education method: Explanation Education comprehension: verbalized understanding  HOME EXERCISE PROGRAM: Not yet initiated  GOALS: Goals reviewed with patient? Yes  SHORT TERM GOALS: Target date: 07/05/2022  Pt will be independent with HEP for improved balance, transfers, gait. Baseline: Goal status: IN PROGRESS  2. Pt/family will verbalize understanding of local community PD resources, including community fitness. Baseline:  Goal status: IN PROGRESS  LONG  TERM GOALS: Target date: 08/02/2022  Pt will be independent with HEP for improved balance, transfers, gait. Baseline:  Goal status: IN PROGRESS  2.  Pt will improve TUG/TUG cognitive score to less than or equal to 10% difference for decreased fall risk.  Baseline: 12.43 sec/13.97 sec Goal status: IN PROGRESS  3.  Pt will improve MiniBESTest score to at least 23/28 to decrease fall risk. Baseline: 20/28 Goal status: IN PROGRESS  ASSESSMENT:  CLINICAL IMPRESSION: Skilled PT session focused on initiating HEP for seated PWR! Moves.  Pt performs well with visual and verbal cues and handouts were provided for home.  Worked also on strengthening/aerobic activity and standing PWR! Moves today for balance, with pt needing cues to slow pace for improved weightshifting and wider BOS.  She will continue to benefit from skilled PT towards goals for improved overall functional mobility and decreased fall risk.    OBJECTIVE IMPAIRMENTS: Abnormal gait, decreased balance, decreased mobility, difficulty walking, decreased strength, and postural dysfunction.   ACTIVITY LIMITATIONS: standing, transfers, locomotion level, and caring for others  PARTICIPATION LIMITATIONS: community activity  PERSONAL FACTORS: Time since onset of injury/illness/exacerbation and 3+ comorbidities: recent dx of PD; see PMH above  are also affecting patient's functional outcome.   REHAB POTENTIAL: Good  CLINICAL DECISION MAKING: Evolving/moderate complexity  EVALUATION COMPLEXITY: Moderate  PLAN:  PT FREQUENCY: 2x/week  PT DURATION: 8 weeks plus eval  PLANNED INTERVENTIONS: Therapeutic exercises, Therapeutic activity, Neuromuscular re-education, Balance training, Gait training, Patient/Family education, and Self Care  PLAN FOR NEXT SESSION: Review HEP-PWR! Moves sitting, continue to work on [employer]! Moves standing; functional strength and balance work.  Discussion on appropriate community fitness options.   Janelys Glassner  W., PT 06/13/2022, 10:14 AM  [facility] [address], [place], [place] [address] Phone # [phone] Fax # [phone]

## 2022-06-17 ENCOUNTER — Ambulatory Visit
Admission: RE | Admit: 2022-06-17 | Discharge: 2022-06-17 | Disposition: A | Discharge status: Home / Self Care | Payer: Medicare Other | Source: Ambulatory Visit | Attending: Diagnostic Neuroimaging | Admitting: Diagnostic Neuroimaging

## 2022-06-17 DIAGNOSIS — G20C Parkinsonism, unspecified: Secondary | ICD-10-CM

## 2022-06-17 DIAGNOSIS — H6123 Impacted cerumen, bilateral: Secondary | ICD-10-CM | POA: Diagnosis not present

## 2022-06-17 MED ORDER — GADOPICLENOL 0.5 MMOL/ML IV SOLN
7.5000 mL | Freq: Once | INTRAVENOUS | Status: AC | PRN
  Administered 2022-06-17: 7.5 mL via INTRAVENOUS

## 2022-06-17 NOTE — Telephone Encounter (Signed)
Normal MRI brain. Rules out other causes, but parkinsonism is still likely diagnosis. Continue current plan. -VRP   Contacted pt, informed her results are Normal MRI brain. Rules out other causes, but parkinsonism is still likely diagnosis. Continue current plan and keep FU in June as scheduled.  Advised to call office back with any questions or concerns as she had none at this time and was appreciative.

## 2022-06-18 ENCOUNTER — Encounter: Payer: Self-pay | Admitting: Physical Therapy

## 2022-06-18 ENCOUNTER — Ambulatory Visit: Payer: Medicare Other | Admitting: Physical Therapy

## 2022-06-18 DIAGNOSIS — R2681 Unsteadiness on feet: Secondary | ICD-10-CM | POA: Diagnosis not present

## 2022-06-18 DIAGNOSIS — M6281 Muscle weakness (generalized): Secondary | ICD-10-CM | POA: Diagnosis not present

## 2022-06-18 DIAGNOSIS — R2689 Other abnormalities of gait and mobility: Secondary | ICD-10-CM

## 2022-06-18 DIAGNOSIS — R293 Abnormal posture: Secondary | ICD-10-CM | POA: Diagnosis not present

## 2022-06-18 NOTE — Therapy (Signed)
OUTPATIENT PHYSICAL THERAPY NEURO TREATMENT   Patient Name: Anne Obrien MRN: [id number] DOB:[birth year], 82 y.o., female Today's Date: 06/18/2022   PCP: Kathyrn Lass MD REFERRING PROVIDER: Penni Bombard, MD   END OF SESSION:  PT End of Session - 06/18/22 0800     Visit Number 3    Number of Visits 17    Date for PT Re-Evaluation 08/02/22    Authorization Type UHC Medicare    PT Start Time 0803    PT Stop Time 0844    PT Time Calculation (min) 41 min    Equipment Utilized During Treatment --    Activity Tolerance Patient tolerated treatment well    Behavior During Therapy WFL for tasks assessed/performed              Past Medical History:  Diagnosis Date   Arthritis    Breast cancer ([place])    stage I left   Bundle branch block    Bursitis    Cancer ([place])    face   Chronic cystitis    Chronic pain    Coronary artery disease    Degenerative disc disease, lumbar    Family history of adverse reaction to anesthesia    pt states her son has to have large amount of anesthesia - difficulty to go under    Family history of breast cancer    Family history of pancreatic cancer 12/08/2019   GERD (gastroesophageal reflux disease)    Hemorrhoids    Hepatitis A 1976   History of measles    History of mumps    Malaria    Malaria    Menopause    Nocturia    Osteopenia    Osteoporosis    Personal history of malignant neoplasm of breast    PONV (postoperative nausea and vomiting)    Urinary tract infection    Varicose veins    Past Surgical History:  Procedure Laterality Date   BARIATRIC SURGERY N/A    BREAST LUMPECTOMY  02/2010   left lumpectomy and sentinel node   SKIN CANCER DESTRUCTION     Nose cryotherapy   TOTAL HIP ARTHROPLASTY Right 06/14/2015   Procedure: RIGHT TOTAL HIP ARTHROPLASTY ANTERIOR APPROACH;  Surgeon: Gaynelle Arabian, MD;  Location: [facility];  Service: Orthopedics;  Laterality: Right;   VAGINAL HYSTERECTOMY  may 2008   and vaginal repair    Patient Active Problem List   Diagnosis Date Noted   Genetic testing 01/10/2020   Family history of pancreatic cancer 12/08/2019   Family history of breast cancer    Personal history of malignant neoplasm of breast    OA (osteoarthritis) of hip 06/14/2015   Preoperative cardiovascular examination 04/18/2015   Degenerative joint disease (DJD) of hip 04/18/2015   Abnormal EKG 04/18/2015   Incomplete RBBB 04/18/2015   Osteopenia 06/21/2014   Nausea and vomiting 01/23/2013   UTI (urinary tract infection) 01/23/2013   Chest pain 01/22/2013   Breast cancer, left breast ([place]) 01/14/2011    ONSET DATE: 05/29/2022 (MD referral)  REFERRING DIAG: G20.C (ICD-10-CM) - Parkinsonism, unspecified Parkinsonism type  THERAPY DIAG:  Unsteadiness on feet  Other abnormalities of gait and mobility  Muscle weakness (generalized)  Abnormal posture  Rationale for Evaluation and Treatment: Rehabilitation  SUBJECTIVE:  SUBJECTIVE STATEMENT: Had the MRI yesterday. Has been a busy week.  No falls. Pt accompanied by: family member  PERTINENT HISTORY: Arthritis, breast Ca, CAD, lumbar DDD, GERD, osteoporosis, R THA (anterior) 2017  PAIN:  Are you having pain? No  Typical arthritis pain in back PRECAUTIONS: Fall  WEIGHT BEARING RESTRICTIONS: No  FALLS: Has patient fallen in last 6 months? Yes. Number of falls at least 2 No injuries, able to get up from the floor.  LIVING ENVIRONMENT: Lives with: lives with their spouse Lives in: House/apartment Stairs: Yes: External: 3 steps; on right going up and on left going up Has following equipment at home: Single point cane and None Cares for husband with dementia  PLOF: Independent Does ROM exercises for legs at home and previous ex from hip surgery.  Enjoys  gardening.  PATIENT GOALS: Goals are for balance  OBJECTIVE:    TODAY'S TREATMENT: 06/18/2022 Activity Comments  NuStep, Level 4, 4 extremities x 6 minutes for aerobic activity Cues to keep SPM >90 for increased intensity  Sit<>stand from mat, 2 x 5 reps No UE support, cues for increased forward lean  Resisted sidestepping red band, 3 reps in parallel bars BUE>1 UE support  Marching in place, 2 x 10 with red band resistance      Gait 85 ft x 2 reps cues for arm swing Improved step length and foot clearance  Side step and weightshift over hurdle, x 10 repsforward step and weigthshift, over obstacle, x 10 reps; back step and weightshift x 10 reps 1 BUE support   Reviewed/performedPWR! Moves in sitting position x 10 reps, with initial 2-3 reps each position for prepare for stretch/warm-up   PWR! Up for improved posture  PWR! Rock for improved weighshifting  PWR! Twist for improved trunk rotation   PWR! Step for improved step initiation   Cues provided for technique and intensity    Pt performs PWR! Moves in standing position x 10-20 reps   PWR! Up for improved posture  PWR! Rock for improved weighshifting  PWR! Twist for improved trunk rotation   PWR! Step for improved step initiation -step over obstacle, cues for brief pause in the middle  Cues provided for technique, intensity, posture/pause   PATIENT EDUCATION: Education details: Review of PWR! Moves sitting; answered questions from pt about aqua exercise classes at [place] educated: Patient Education method: Explanation, Demonstration, Verbal cues, and Handouts Education comprehension: verbalized understanding, returned demonstration, verbal cues required, and needs further education    --------------------------------------------------------------------------- (Objective measures below taken at initial evaluation):  DIAGNOSTIC FINDINGS: MRI scheduled in March  COGNITION: Overall cognitive status: Within  functional limits for tasks assessed   SENSATION: Light touch: WFL   POSTURE: rounded shoulders  LOWER EXTREMITY ROM:   WFL   LOWER EXTREMITY MMT:    MMT Right Eval Left Eval  Hip flexion 4 4  Hip extension    Hip abduction    Hip adduction    Hip internal rotation    Hip external rotation    Knee flexion 4 4  Knee extension 4 4  Ankle dorsiflexion 3+ 4  Ankle plantarflexion    Ankle inversion    Ankle eversion    (Blank rows = not tested)    TRANSFERS: Assistive device utilized: None  Sit to stand: SBA Stand to sit: SBA  GAIT: Gait pattern: step through pattern, decreased step length- Right, decreased step length- Left, and narrow BOS Distance walked: 50 ft x 2 Assistive device utilized: None  Level of assistance: Modified independence and SBA  FUNCTIONAL TESTS:  5 times sit to stand: 9.97 sec with posterior lean Timed up and go (TUG): 12.43 sec 10 meter walk test: 10.72 sec (3.06 ft/sec) TUG cognitive: 13.97 sec MiniBEST: 20/28 (scores <20/28 indicate increased fall risk TUG manual:  11.97 sec 360 degree turn to R:  9 steps, to L:  9 10 M fast:  8.25 sec (3.98 ft/sec) 84M back:  10.12 sec (0.99 ft/sec)     TODAY'S TREATMENT:                                                                                                                              DATE: 06/05/2022    PATIENT EDUCATION: Education details: Pt eval results, POC Person educated: Patient and Child(ren) Education method: Explanation Education comprehension: verbalized understanding  HOME EXERCISE PROGRAM: Not yet initiated  GOALS: Goals reviewed with patient? Yes  SHORT TERM GOALS: Target date: 07/05/2022  Pt will be independent with HEP for improved balance, transfers, gait. Baseline: Goal status: IN PROGRESS  2. Pt/family will verbalize understanding of local community PD resources, including community fitness. Baseline:  Goal status: IN PROGRESS  LONG TERM GOALS: Target  date: 08/02/2022  Pt will be independent with HEP for improved balance, transfers, gait. Baseline:  Goal status: IN PROGRESS  2.  Pt will improve TUG/TUG cognitive score to less than or equal to 10% difference for decreased fall risk.  Baseline: 12.43 sec/13.97 sec Goal status: IN PROGRESS  3.  Pt will improve MiniBESTest score to at least 23/28 to decrease fall risk. Baseline: 20/28 Goal status: IN PROGRESS  ASSESSMENT:  CLINICAL IMPRESSION: Skilled PT session focused on reviewing HEP for seated PWR! Moves and trying again large amplitude movement patterns in standing with standing PWR! Moves.  Used obstacles for forward and side step/weightshift activity to improve foot clearance and step length.  Pt does continue to need cues for slowed pace and larger overall movement patterns.  She will continue to benefit from skilled PT towards goals for improved overall functional mobility and decreased fall risk.    OBJECTIVE IMPAIRMENTS: Abnormal gait, decreased balance, decreased mobility, difficulty walking, decreased strength, and postural dysfunction.   ACTIVITY LIMITATIONS: standing, transfers, locomotion level, and caring for others  PARTICIPATION LIMITATIONS: community activity  PERSONAL FACTORS: Time since onset of injury/illness/exacerbation and 3+ comorbidities: recent dx of PD; see PMH above  are also affecting patient's functional outcome.   REHAB POTENTIAL: Good  CLINICAL DECISION MAKING: Evolving/moderate complexity  EVALUATION COMPLEXITY: Moderate  PLAN:  PT FREQUENCY: 2x/week  PT DURATION: 8 weeks plus eval  PLANNED INTERVENTIONS: Therapeutic exercises, Therapeutic activity, Neuromuscular re-education, Balance training, Gait training, Patient/Family education, and Self Care  PLAN FOR NEXT SESSION: Continue with large amplitude movements, continue to work on [employer]! Moves standing; functional strength and balance work.  Discussion on appropriate community fitness  options.   Alvine Mostafa W., PT 06/18/2022, 8:46 AM  [facility] at [facility] [address], [place], [place] [address] Phone # [phone] Fax # [phone]

## 2022-06-20 ENCOUNTER — Encounter: Payer: Self-pay | Admitting: Physical Therapy

## 2022-06-20 ENCOUNTER — Ambulatory Visit: Payer: Medicare Other | Admitting: Physical Therapy

## 2022-06-20 DIAGNOSIS — M6281 Muscle weakness (generalized): Secondary | ICD-10-CM | POA: Diagnosis not present

## 2022-06-20 DIAGNOSIS — R2689 Other abnormalities of gait and mobility: Secondary | ICD-10-CM | POA: Diagnosis not present

## 2022-06-20 DIAGNOSIS — R293 Abnormal posture: Secondary | ICD-10-CM | POA: Diagnosis not present

## 2022-06-20 DIAGNOSIS — R2681 Unsteadiness on feet: Secondary | ICD-10-CM

## 2022-06-20 NOTE — Therapy (Signed)
OUTPATIENT PHYSICAL THERAPY NEURO TREATMENT   Patient Name: Anne Obrien MRN: [id number] DOB:07-Nov-1940, 83 y.o., female Today's Date: 06/20/2022   PCP: Kathyrn Lass MD REFERRING PROVIDER: Penni Bombard, MD   END OF SESSION:  PT End of Session - 06/20/22 0847     Visit Number 4    Number of Visits 17    Date for PT Re-Evaluation 08/02/22    Authorization Type UHC Medicare    PT Start Time [phone]    PT Stop Time 0930    PT Time Calculation (min) 41 min    Activity Tolerance Patient tolerated treatment well    Behavior During Therapy WFL for tasks assessed/performed              Past Medical History:  Diagnosis Date   Arthritis    Breast cancer ([place])    stage I left   Bundle branch block    Bursitis    Cancer ([place])    face   Chronic cystitis    Chronic pain    Coronary artery disease    Degenerative disc disease, lumbar    Family history of adverse reaction to anesthesia    pt states her son has to have large amount of anesthesia - difficulty to go under    Family history of breast cancer    Family history of pancreatic cancer 12/08/2019   GERD (gastroesophageal reflux disease)    Hemorrhoids    Hepatitis A 1976   History of measles    History of mumps    Malaria    Malaria    Menopause    Nocturia    Osteopenia    Osteoporosis    Personal history of malignant neoplasm of breast    PONV (postoperative nausea and vomiting)    Urinary tract infection    Varicose veins    Past Surgical History:  Procedure Laterality Date   BARIATRIC SURGERY N/A    BREAST LUMPECTOMY  02/2010   left lumpectomy and sentinel node   SKIN CANCER DESTRUCTION     Nose cryotherapy   TOTAL HIP ARTHROPLASTY Right 06/14/2015   Procedure: RIGHT TOTAL HIP ARTHROPLASTY ANTERIOR APPROACH;  Surgeon: Gaynelle Arabian, MD;  Location: [facility];  Service: Orthopedics;  Laterality: Right;   VAGINAL HYSTERECTOMY  may 2008   and vaginal repair   Patient Active Problem List   Diagnosis  Date Noted   Genetic testing 01/10/2020   Family history of pancreatic cancer 12/08/2019   Family history of breast cancer    Personal history of malignant neoplasm of breast    OA (osteoarthritis) of hip 06/14/2015   Preoperative cardiovascular examination 04/18/2015   Degenerative joint disease (DJD) of hip 04/18/2015   Abnormal EKG 04/18/2015   Incomplete RBBB 04/18/2015   Osteopenia 06/21/2014   Nausea and vomiting 01/23/2013   UTI (urinary tract infection) 01/23/2013   Chest pain 01/22/2013   Breast cancer, left breast ([place]) 01/14/2011    ONSET DATE: 05/29/2022 (MD referral)  REFERRING DIAG: G20.C (ICD-10-CM) - Parkinsonism, unspecified Parkinsonism type  THERAPY DIAG:  Unsteadiness on feet  Other abnormalities of gait and mobility  Muscle weakness (generalized)  Abnormal posture  Rationale for Evaluation and Treatment: Rehabilitation  SUBJECTIVE:  SUBJECTIVE STATEMENT: MRI results show that my brain is normal.  No changes since last visit. Pt accompanied by: family member  PERTINENT HISTORY: Arthritis, breast Ca, CAD, lumbar DDD, GERD, osteoporosis, R THA (anterior) 2017  PAIN:  Are you having pain? No  Typical arthritis pain in back PRECAUTIONS: Fall  WEIGHT BEARING RESTRICTIONS: No  FALLS: Has patient fallen in last 6 months? Yes. Number of falls at least 2 No injuries, able to get up from the floor.  LIVING ENVIRONMENT: Lives with: lives with their spouse Lives in: House/apartment Stairs: Yes: External: 3 steps; on right going up and on left going up Has following equipment at home: Single point cane and None Cares for husband with dementia  PLOF: Independent Does ROM exercises for legs at home and previous ex from hip surgery.  Enjoys gardening.  PATIENT GOALS:  Goals are for balance  OBJECTIVE:       TODAY'S TREATMENT: 06/20/2022 Activity Comments  NuStep, Level 4, 4 extremities x 6 minutes for aerobic activity Cues to keep SPM >90 for increased intensity  Sit<>stand from mat, 3 x 5 reps; 3rd set standing on Airex No UE support, mild unsteadiness and slowed pace with standing on Airex  Standing on Airex, no support: Feet apart with head turns, head nods EO, then EC 30 sec; performed 2 sets Min guard; better balance with cues for upright posture  Forward gait 30 ft x 4, with arm swing Cues to reset posture with turn  Forward/back walking 30 ft x 2 reps    Sidestepping R and L 30 ft x 2 reps with added arm motions   Forward walking 50 ft x 4 reps, with quick stop/starts Pt with multiple episode of near LOB with planting foot too far apart.  Cues for PWR! Up for posture and reset step length to regain balance; PT provides min guard/ min assist at times     Side step and weightshift over pool noodle (low), then orange hurdle, 10 x 2, then forward step and weigthshift over obstacles, 10 x 2, back step and weightshift (no obstacle) x 10 1-2 BUE support, initial cues for increased step length and foot clearance    Pt performs PWR! Moves in standing position x 10-20 reps   PWR! Up for improved posture  PWR! Rock for improved weighshifting  PWR! Twist for improved trunk rotation   PWR! Step for improved step initiation -step over obstacle, cues for brief pause in the middle  Cues provided for technique, intensity, posture/pause   PATIENT EDUCATION: Education details: added PWR! Moves in standing to HEP Person educated: Patient Education method: Explanation, Demonstration, Verbal cues, and Handouts Education comprehension: verbalized understanding, returned demonstration, verbal cues required, and needs further education   HEP: -PWR! Moves in sitting 10-20 reps, 1x daily PWR! Moves in standing 10-20 reps, 1 x daily (added  06/20/2022)  --------------------------------------------------------------------------- (Objective measures below taken at initial evaluation):  DIAGNOSTIC FINDINGS: MRI scheduled in March  COGNITION: Overall cognitive status: Within functional limits for tasks assessed   SENSATION: Light touch: WFL   POSTURE: rounded shoulders  LOWER EXTREMITY ROM:   WFL   LOWER EXTREMITY MMT:    MMT Right Eval Left Eval  Hip flexion 4 4  Hip extension    Hip abduction    Hip adduction    Hip internal rotation    Hip external rotation    Knee flexion 4 4  Knee extension 4 4  Ankle dorsiflexion 3+ 4  Ankle plantarflexion  Ankle inversion    Ankle eversion    (Blank rows = not tested)    TRANSFERS: Assistive device utilized: None  Sit to stand: SBA Stand to sit: SBA  GAIT: Gait pattern: step through pattern, decreased step length- Right, decreased step length- Left, and narrow BOS Distance walked: 50 ft x 2 Assistive device utilized: None Level of assistance: Modified independence and SBA  FUNCTIONAL TESTS:  5 times sit to stand: 9.97 sec with posterior lean Timed up and go (TUG): 12.43 sec 10 meter walk test: 10.72 sec (3.06 ft/sec) TUG cognitive: 13.97 sec MiniBEST: 20/28 (scores <20/28 indicate increased fall risk TUG manual:  11.97 sec 360 degree turn to R:  9 steps, to L:  9 10 M fast:  8.25 sec (3.98 ft/sec) 63M back:  10.12 sec (0.99 ft/sec)     GOALS: Goals reviewed with patient? Yes  SHORT TERM GOALS: Target date: 07/05/2022  Pt will be independent with HEP for improved balance, transfers, gait. Baseline: Goal status: IN PROGRESS  2. Pt/family will verbalize understanding of local community PD resources, including community fitness. Baseline:  Goal status: IN PROGRESS  LONG TERM GOALS: Target date: 08/02/2022  Pt will be independent with HEP for improved balance, transfers, gait. Baseline:  Goal status: IN PROGRESS  2.  Pt will improve TUG/TUG  cognitive score to less than or equal to 10% difference for decreased fall risk.  Baseline: 12.43 sec/13.97 sec Goal status: IN PROGRESS  3.  Pt will improve MiniBESTest score to at least 23/28 to decrease fall risk. Baseline: 20/28 Goal status: IN PROGRESS  ASSESSMENT:  CLINICAL IMPRESSION: Skilled PT session focused on large amplitude movement patterns in standing with standing PWR! Moves; able to add to HEP today.  To work on larger step length and foot clearance, used obstacles for forward and side step/weightshift activity.  With dynamic balance/gait activities away from UE support with quick stop/start movements, she has LOB with quick stops.  PT provides min assist and cues to try to promote more upright posture and wider BOS to regain balance; she will need more practice with this.  She will continue to benefit from skilled PT towards goals for improved overall functional mobility and decreased fall risk.    OBJECTIVE IMPAIRMENTS: Abnormal gait, decreased balance, decreased mobility, difficulty walking, decreased strength, and postural dysfunction.   ACTIVITY LIMITATIONS: standing, transfers, locomotion level, and caring for others  PARTICIPATION LIMITATIONS: community activity  PERSONAL FACTORS: Time since onset of injury/illness/exacerbation and 3+ comorbidities: recent dx of PD; see PMH above  are also affecting patient's functional outcome.   REHAB POTENTIAL: Good  CLINICAL DECISION MAKING: Evolving/moderate complexity  EVALUATION COMPLEXITY: Moderate  PLAN:  PT FREQUENCY: 2x/week  PT DURATION: 8 weeks plus eval  PLANNED INTERVENTIONS: Therapeutic exercises, Therapeutic activity, Neuromuscular re-education, Balance training, Gait training, Patient/Family education, and Self Care  PLAN FOR NEXT SESSION: Review PWR! Moves standing; functional strength and balance work.  Dynamic gait-working on balance with quick stop/starts.  Discussion on appropriate community fitness  options.   Frazier Butt., PT 06/20/2022, 9:33 AM  [facility] at [facility] [place], [place], [place] [address] Phone # [phone] Fax # [phone]

## 2022-06-25 ENCOUNTER — Encounter: Payer: Self-pay | Admitting: Physical Therapy

## 2022-06-25 ENCOUNTER — Ambulatory Visit: Payer: Medicare Other | Admitting: Physical Therapy

## 2022-06-25 DIAGNOSIS — R2681 Unsteadiness on feet: Secondary | ICD-10-CM | POA: Diagnosis not present

## 2022-06-25 DIAGNOSIS — M6281 Muscle weakness (generalized): Secondary | ICD-10-CM

## 2022-06-25 DIAGNOSIS — R293 Abnormal posture: Secondary | ICD-10-CM

## 2022-06-25 DIAGNOSIS — R2689 Other abnormalities of gait and mobility: Secondary | ICD-10-CM | POA: Diagnosis not present

## 2022-06-25 NOTE — Therapy (Signed)
OUTPATIENT PHYSICAL THERAPY NEURO TREATMENT   Patient Name: Anne Obrien MRN: [id number] DOB:Nov 27, 1940, 82 y.o., female Today's Date: 06/25/2022   PCP: Kathyrn Lass MD REFERRING PROVIDER: Penni Bombard, MD   END OF SESSION:  PT End of Session - 06/25/22 1026     Visit Number 5    Number of Visits 17    Date for PT Re-Evaluation 08/02/22    Authorization Type UHC Medicare    PT Start Time 1026   pt arrives late   PT Stop Time 1057    PT Time Calculation (min) 31 min    Activity Tolerance Patient tolerated treatment well    Behavior During Therapy WFL for tasks assessed/performed              Past Medical History:  Diagnosis Date   Arthritis    Breast cancer ([place])    stage I left   Bundle branch block    Bursitis    Cancer ([place])    face   Chronic cystitis    Chronic pain    Coronary artery disease    Degenerative disc disease, lumbar    Family history of adverse reaction to anesthesia    pt states her son has to have large amount of anesthesia - difficulty to go under    Family history of breast cancer    Family history of pancreatic cancer 12/08/2019   GERD (gastroesophageal reflux disease)    Hemorrhoids    Hepatitis A 1976   History of measles    History of mumps    Malaria    Malaria    Menopause    Nocturia    Osteopenia    Osteoporosis    Personal history of malignant neoplasm of breast    PONV (postoperative nausea and vomiting)    Urinary tract infection    Varicose veins    Past Surgical History:  Procedure Laterality Date   BARIATRIC SURGERY N/A    BREAST LUMPECTOMY  02/2010   left lumpectomy and sentinel node   SKIN CANCER DESTRUCTION     Nose cryotherapy   TOTAL HIP ARTHROPLASTY Right 06/14/2015   Procedure: RIGHT TOTAL HIP ARTHROPLASTY ANTERIOR APPROACH;  Surgeon: Gaynelle Arabian, MD;  Location: [facility];  Service: Orthopedics;  Laterality: Right;   VAGINAL HYSTERECTOMY  may 2008   and vaginal repair   Patient Active Problem  List   Diagnosis Date Noted   Genetic testing 01/10/2020   Family history of pancreatic cancer 12/08/2019   Family history of breast cancer    Personal history of malignant neoplasm of breast    OA (osteoarthritis) of hip 06/14/2015   Preoperative cardiovascular examination 04/18/2015   Degenerative joint disease (DJD) of hip 04/18/2015   Abnormal EKG 04/18/2015   Incomplete RBBB 04/18/2015   Osteopenia 06/21/2014   Nausea and vomiting 01/23/2013   UTI (urinary tract infection) 01/23/2013   Chest pain 01/22/2013   Breast cancer, left breast ([place]) 01/14/2011    ONSET DATE: 05/29/2022 (MD referral)  REFERRING DIAG: G20.C (ICD-10-CM) - Parkinsonism, unspecified Parkinsonism type  THERAPY DIAG:  Unsteadiness on feet  Other abnormalities of gait and mobility  Abnormal posture  Muscle weakness (generalized)  Rationale for Evaluation and Treatment: Rehabilitation  SUBJECTIVE:  SUBJECTIVE STATEMENT: Got lost coming in today-that's why I'm late.  Keep missing the turn.  I will start the new aquatics class in April.  I have started using my stationary bike at home. Pt accompanied by: family member  PERTINENT HISTORY: Arthritis, breast Ca, CAD, lumbar DDD, GERD, osteoporosis, R THA (anterior) 2017  PAIN:  Are you having pain? No  Typical arthritis pain in back PRECAUTIONS: Fall  WEIGHT BEARING RESTRICTIONS: No  FALLS: Has patient fallen in last 6 months? Yes. Number of falls at least 2 No injuries, able to get up from the floor.  LIVING ENVIRONMENT: Lives with: lives with their spouse Lives in: House/apartment Stairs: Yes: External: 3 steps; on right going up and on left going up Has following equipment at home: Single point cane and None Cares for husband with dementia  PLOF:  Independent Does ROM exercises for legs at home and previous ex from hip surgery.  Enjoys gardening.  PATIENT GOALS: Goals are for balance  OBJECTIVE:     TODAY'S TREATMENT: 06/25/2022 Activity Comments  Side step and weightshift over pool noodle 10 x 2, then forward step and weigthshift over obstacles, 10 x 2, back step and weightshift (no obstacle) x 10 Initial cues for foot clearance, intermittent UE support  Forward<>back step and weightshift 10 reps, standing on solid ground and then on Airex UE support and slowed pace for forward/back step on Airex  Forward/back walking 5 reps at counter Cues for foot clearance/step length in posterior direction  Stagger stance position with head turns/head nods x 5; then EC 15 sec Min guard with EC with lateral instability; cues to PWR! Up tall in stagger stance posture          Pt performs/reviews PWR! Moves in standing position x 10-20 reps   PWR! Up for improved posture  PWR! Rock for improved weighshifting  PWR! Twist for improved trunk rotation   PWR! Step for improved step initiation -step over obstacle, cues for brief pause in the middle  Cues provided for technique, intensity, posture/pause at end ranges     PATIENT EDUCATION: Education details: Reviewed PWR! Moves in standing to HEP.  Cues to slowed pace and hold at end ranges Person educated: Patient Education method: Explanation, Demonstration, Verbal cues, and Handouts Education comprehension: verbalized understanding, returned demonstration, verbal cues required, and needs further education   HEP: -PWR! Moves in sitting 10-20 reps, 1x daily PWR! Moves in standing 10-20 reps, 1 x daily (added 06/20/2022)  --------------------------------------------------------------------------- (Objective measures below taken at initial evaluation):  DIAGNOSTIC FINDINGS: MRI scheduled in March  COGNITION: Overall cognitive status: Within functional limits for tasks  assessed   SENSATION: Light touch: WFL   POSTURE: rounded shoulders  LOWER EXTREMITY ROM:   WFL   LOWER EXTREMITY MMT:    MMT Right Eval Left Eval  Hip flexion 4 4  Hip extension    Hip abduction    Hip adduction    Hip internal rotation    Hip external rotation    Knee flexion 4 4  Knee extension 4 4  Ankle dorsiflexion 3+ 4  Ankle plantarflexion    Ankle inversion    Ankle eversion    (Blank rows = not tested)    TRANSFERS: Assistive device utilized: None  Sit to stand: SBA Stand to sit: SBA  GAIT: Gait pattern: step through pattern, decreased step length- Right, decreased step length- Left, and narrow BOS Distance walked: 50 ft x 2 Assistive device utilized: None Level of assistance:  Modified independence and SBA  FUNCTIONAL TESTS:  5 times sit to stand: 9.97 sec with posterior lean Timed up and go (TUG): 12.43 sec 10 meter walk test: 10.72 sec (3.06 ft/sec) TUG cognitive: 13.97 sec MiniBEST: 20/28 (scores <20/28 indicate increased fall risk TUG manual:  11.97 sec 360 degree turn to R:  9 steps, to L:  9 10 M fast:  8.25 sec (3.98 ft/sec) 54M back:  10.12 sec (0.99 ft/sec)     GOALS: Goals reviewed with patient? Yes  SHORT TERM GOALS: Target date: 07/05/2022  Pt will be independent with HEP for improved balance, transfers, gait. Baseline: Goal status: IN PROGRESS  2. Pt/family will verbalize understanding of local community PD resources, including community fitness. Baseline:  Goal status: IN PROGRESS  LONG TERM GOALS: Target date: 08/02/2022  Pt will be independent with HEP for improved balance, transfers, gait. Baseline:  Goal status: IN PROGRESS  2.  Pt will improve TUG/TUG cognitive score to less than or equal to 10% difference for decreased fall risk.  Baseline: 12.43 sec/13.97 sec Goal status: IN PROGRESS  3.  Pt will improve MiniBESTest score to at least 23/28 to decrease fall risk. Baseline: 20/28 Goal status: IN  PROGRESS  ASSESSMENT:  CLINICAL IMPRESSION: Review of PWR! Moves standing today, with pt requireing cues for slowed pace/hold at end ranges for optimal posture, weightshifting and balance work.  She needs cues for increased step length and she responds well to use of obstacles.  She is more challenged by forward>back continued stepping as well as partial tandem and compliant surfaces.  She denies falls.  She will continue to benefit from skilled PT towards goals for improved functional mobility and decreased fall risk.  OBJECTIVE IMPAIRMENTS: Abnormal gait, decreased balance, decreased mobility, difficulty walking, decreased strength, and postural dysfunction.   ACTIVITY LIMITATIONS: standing, transfers, locomotion level, and caring for others  PARTICIPATION LIMITATIONS: community activity  PERSONAL FACTORS: Time since onset of injury/illness/exacerbation and 3+ comorbidities: recent dx of PD; see PMH above  are also affecting patient's functional outcome.   REHAB POTENTIAL: Good  CLINICAL DECISION MAKING: Evolving/moderate complexity  EVALUATION COMPLEXITY: Moderate  PLAN:  PT FREQUENCY: 2x/week  PT DURATION: 8 weeks plus eval  PLANNED INTERVENTIONS: Therapeutic exercises, Therapeutic activity, Neuromuscular re-education, Balance training, Gait training, Patient/Family education, and Self Care  PLAN FOR NEXT SESSION: Continue to review PWR! Moves standing; functional strength and balance work.  Dynamic gait-working on balance with quick stop/starts (may add to HEP).  Discussion on appropriate community fitness options.   Frazier Butt., PT 06/25/2022, 11:03 AM  [facility] at [facility], [place], [place] [address] Phone # [phone] Fax # [phone]

## 2022-06-27 ENCOUNTER — Ambulatory Visit: Payer: Medicare Other | Admitting: Physical Therapy

## 2022-06-27 ENCOUNTER — Encounter: Payer: Self-pay | Admitting: Physical Therapy

## 2022-06-27 DIAGNOSIS — R2681 Unsteadiness on feet: Secondary | ICD-10-CM | POA: Diagnosis not present

## 2022-06-27 DIAGNOSIS — R293 Abnormal posture: Secondary | ICD-10-CM

## 2022-06-27 DIAGNOSIS — M6281 Muscle weakness (generalized): Secondary | ICD-10-CM | POA: Diagnosis not present

## 2022-06-27 DIAGNOSIS — R309 Painful micturition, unspecified: Secondary | ICD-10-CM | POA: Diagnosis not present

## 2022-06-27 DIAGNOSIS — R2689 Other abnormalities of gait and mobility: Secondary | ICD-10-CM | POA: Diagnosis not present

## 2022-06-27 NOTE — Therapy (Signed)
OUTPATIENT PHYSICAL THERAPY NEURO TREATMENT   Patient Name: Anne Obrien MRN: [id number] DOB:1941/03/19, 82 y.o., female Today's Date: 06/28/2022   PCP: Kathyrn Lass MD REFERRING PROVIDER: Penni Bombard, MD   END OF SESSION:  PT End of Session - 06/27/22 1449     Visit Number 6    Number of Visits 17    Date for PT Re-Evaluation 08/02/22    Authorization Type [employer] Medicare    PT Start Time 1448    PT Stop Time [id number]    PT Time Calculation (min) 42 min    Activity Tolerance Patient tolerated treatment well    Behavior During Therapy WFL for tasks assessed/performed              Past Medical History:  Diagnosis Date   Arthritis    Breast cancer ([place])    stage I left   Bundle branch block    Bursitis    Cancer ([place])    face   Chronic cystitis    Chronic pain    Coronary artery disease    Degenerative disc disease, lumbar    Family history of adverse reaction to anesthesia    pt states her son has to have large amount of anesthesia - difficulty to go under    Family history of breast cancer    Family history of pancreatic cancer 12/08/2019   GERD (gastroesophageal reflux disease)    Hemorrhoids    Hepatitis A 1976   History of measles    History of mumps    Malaria    Malaria    Menopause    Nocturia    Osteopenia    Osteoporosis    Personal history of malignant neoplasm of breast    PONV (postoperative nausea and vomiting)    Urinary tract infection    Varicose veins    Past Surgical History:  Procedure Laterality Date   BARIATRIC SURGERY N/A    BREAST LUMPECTOMY  02/2010   left lumpectomy and sentinel node   SKIN CANCER DESTRUCTION     Nose cryotherapy   TOTAL HIP ARTHROPLASTY Right 06/14/2015   Procedure: RIGHT TOTAL HIP ARTHROPLASTY ANTERIOR APPROACH;  Surgeon: Gaynelle Arabian, MD;  Location: [facility];  Service: Orthopedics;  Laterality: Right;   VAGINAL HYSTERECTOMY  may 2008   and vaginal repair   Patient Active Problem List   Diagnosis  Date Noted   Genetic testing 01/10/2020   Family history of pancreatic cancer 12/08/2019   Family history of breast cancer    Personal history of malignant neoplasm of breast    OA (osteoarthritis) of hip 06/14/2015   Preoperative cardiovascular examination 04/18/2015   Degenerative joint disease (DJD) of hip 04/18/2015   Abnormal EKG 04/18/2015   Incomplete RBBB 04/18/2015   Osteopenia 06/21/2014   Nausea and vomiting 01/23/2013   UTI (urinary tract infection) 01/23/2013   Chest pain 01/22/2013   Breast cancer, left breast ([place]) 01/14/2011    ONSET DATE: 05/29/2022 (MD referral)  REFERRING DIAG: G20.C (ICD-10-CM) - Parkinsonism, unspecified Parkinsonism type  THERAPY DIAG:  Unsteadiness on feet  Other abnormalities of gait and mobility  Abnormal posture  Rationale for Evaluation and Treatment: Rehabilitation  SUBJECTIVE:  SUBJECTIVE STATEMENT: Have been doing the exercises at home.  Been doing the stationary bike.  Have a bladder infection, but getting ready to pick up some medications. Pt accompanied by: family member  PERTINENT HISTORY: Arthritis, breast Ca, CAD, lumbar DDD, GERD, osteoporosis, R THA (anterior) 2017  PAIN:  Are you having pain? No  Typical arthritis pain in back PRECAUTIONS: Fall  WEIGHT BEARING RESTRICTIONS: No  FALLS: Has patient fallen in last 6 months? Yes. Number of falls at least 2 No injuries, able to get up from the floor.  LIVING ENVIRONMENT: Lives with: lives with their spouse Lives in: House/apartment Stairs: Yes: External: 3 steps; on right going up and on left going up Has following equipment at home: Single point cane and None Cares for husband with dementia  PLOF: Independent Does ROM exercises for legs at home and previous ex from hip  surgery.  Enjoys gardening.  PATIENT GOALS: Goals are for balance  OBJECTIVE:     TODAY'S TREATMENT: 06/27/2022 Activity Comments  Forward/back walking 25 ft x 5 reps Supervision, 1 LOB in posterior direction  Forward marching, 25 ft x 4 reps Min guard, pt reports feeling off balance  Monster walks forward x 4 reps, then with added SLS x 4 reps At counter, but no UE support, min guard  Resisted sidestepping at counter, red theraband 3 reps   Alt step taps to 4" step, 2 x 10 reps with BUE support>1 UE support   Heel toe raises 2 x 10 reps Counter support  Feet together/feet semitandem with head turns/nods EO, x 5   Quarter turns 90 degrees R <>center<>L center, 5 reps, then progressed to short distance gait and weightshifting turns Pt has several LOB with initial quarter turn practice, needing cues for smaller step length and slowed pace   Pt performs/reviews PWR! Moves in standing position x 10-20 reps   PWR! Up for improved posture  PWR! Rock for improved weighshifting  PWR! Twist for improved trunk rotation   PWR! Step for improved step initiation -cues for step height, cues for brief pause in the middle  Cues provided for technique, intensity, better posture/pause at end ranges    Access Code: [email or web address] URL: [email or web address] Date: 06/27/2022 Prepared by: [facility]  Exercises - Standing Quarter Turn with Counter Support  - 1 x daily - 5 x weekly - 1 sets - 5 reps HEP: -PWR! Moves in sitting 10-20 reps, 1x daily PWR! Moves in standing 10-20 reps, 1 x daily (added 06/20/2022)  PATIENT EDUCATION: Education details: HEP-quarter turns, slowed pace with turns Person educated: Patient Education method: Explanation, Demonstration, and Handouts Education comprehension: verbalized understanding, returned demonstration, and needs further  education  --------------------------------------------------------------------------- (Objective measures below taken at initial evaluation):  DIAGNOSTIC FINDINGS: MRI scheduled in March  COGNITION: Overall cognitive status: Within functional limits for tasks assessed   SENSATION: Light touch: WFL   POSTURE: rounded shoulders  LOWER EXTREMITY ROM:   WFL   LOWER EXTREMITY MMT:    MMT Right Eval Left Eval  Hip flexion 4 4  Hip extension    Hip abduction    Hip adduction    Hip internal rotation    Hip external rotation    Knee flexion 4 4  Knee extension 4 4  Ankle dorsiflexion 3+ 4  Ankle plantarflexion    Ankle inversion    Ankle eversion    (Blank rows = not tested)    TRANSFERS: Assistive device utilized:  None  Sit to stand: SBA Stand to sit: SBA  GAIT: Gait pattern: step through pattern, decreased step length- Right, decreased step length- Left, and narrow BOS Distance walked: 50 ft x 2 Assistive device utilized: None Level of assistance: Modified independence and SBA  FUNCTIONAL TESTS:  5 times sit to stand: 9.97 sec with posterior lean Timed up and go (TUG): 12.43 sec 10 meter walk test: 10.72 sec (3.06 ft/sec) TUG cognitive: 13.97 sec MiniBEST: 20/28 (scores <20/28 indicate increased fall risk TUG manual:  11.97 sec 360 degree turn to R:  9 steps, to L:  9 10 M fast:  8.25 sec (3.98 ft/sec) 58M back:  10.12 sec (0.99 ft/sec)     GOALS: Goals reviewed with patient? Yes  SHORT TERM GOALS: Target date: 07/05/2022  Pt will be independent with HEP for improved balance, transfers, gait. Baseline: Goal status: IN PROGRESS  2. Pt/family will verbalize understanding of local community PD resources, including community fitness. Baseline:  Goal status: IN PROGRESS  LONG TERM GOALS: Target date: 08/02/2022  Pt will be independent with HEP for improved balance, transfers, gait. Baseline:  Goal status: IN PROGRESS  2.  Pt will improve TUG/TUG  cognitive score to less than or equal to 10% difference for decreased fall risk.  Baseline: 12.43 sec/13.97 sec Goal status: IN PROGRESS  3.  Pt will improve MiniBESTest score to at least 23/28 to decrease fall risk. Baseline: 20/28 Goal status: IN PROGRESS  ASSESSMENT:  CLINICAL IMPRESSION: Continued to review of PWR! Moves standing today, with pt requireing less cues today for slowed pace/hold at end ranges.  Worked on dynamic balance and turns, with pt having some LOB with quick turns R and L from a stationary standing position.  Worked on quarter turns and walking weightshifting turns, with pt improving with these during session with repetition.  She will continue to benefit from skilled PT towards goals for improved functional mobility and decreased fall risk.   OBJECTIVE IMPAIRMENTS: Abnormal gait, decreased balance, decreased mobility, difficulty walking, decreased strength, and postural dysfunction.   ACTIVITY LIMITATIONS: standing, transfers, locomotion level, and caring for others  PARTICIPATION LIMITATIONS: community activity  PERSONAL FACTORS: Time since onset of injury/illness/exacerbation and 3+ comorbidities: recent dx of PD; see PMH above  are also affecting patient's functional outcome.   REHAB POTENTIAL: Good  CLINICAL DECISION MAKING: Evolving/moderate complexity  EVALUATION COMPLEXITY: Moderate  PLAN:  PT FREQUENCY: 2x/week  PT DURATION: 8 weeks plus eval  PLANNED INTERVENTIONS: Therapeutic exercises, Therapeutic activity, Neuromuscular re-education, Balance training, Gait training, Patient/Family education, and Self Care  PLAN FOR NEXT SESSION: Assess STGs (pt may only have 1 more appt); check about POC and may assess LTGs /discharge depending on progress or what pt is able to do.  Continue to review PWR! Moves standing; functional strength and balance work.  Dynamic gait-working on balance with quick stop/starts (may add to HEP).  Discussion on appropriate  community fitness options.   Frazier Butt., PT 06/28/2022, 2:10 PM  [facility] at [facility] [place], [place], [place] [address] Phone # [phone] Fax # [phone]

## 2022-07-02 ENCOUNTER — Ambulatory Visit: Payer: Medicare Other | Admitting: Physical Therapy

## 2022-07-02 ENCOUNTER — Encounter: Payer: Self-pay | Admitting: Physical Therapy

## 2022-07-02 DIAGNOSIS — R293 Abnormal posture: Secondary | ICD-10-CM

## 2022-07-02 DIAGNOSIS — R2689 Other abnormalities of gait and mobility: Secondary | ICD-10-CM | POA: Diagnosis not present

## 2022-07-02 DIAGNOSIS — R2681 Unsteadiness on feet: Secondary | ICD-10-CM | POA: Diagnosis not present

## 2022-07-02 DIAGNOSIS — M6281 Muscle weakness (generalized): Secondary | ICD-10-CM | POA: Diagnosis not present

## 2022-07-02 NOTE — Therapy (Signed)
OUTPATIENT PHYSICAL THERAPY NEURO TREATMENT/DISCHARGE SUMMARY   Patient Name: Anne Obrien MRN: [id number] DOB:[birth year], 82 y.o., female Today's Date: 07/02/2022   PCP: Kathyrn Lass MD REFERRING PROVIDER: Penni Bombard, MD    PHYSICAL THERAPY DISCHARGE SUMMARY  Visits from Start of Care: 7  Current functional level related to goals / functional outcomes: See LTGs below.  Pt has met all STGs and LTGs.   Remaining deficits: High level balance deficits-improving   Education / Equipment: Educated in [employer], community PD resources, and plans for return screens.   Patient agrees to discharge. Patient goals were met. Patient is being discharged due to meeting the stated rehab goals.  Recommend PT, OT, speech therapy screens in 6-9 months due to progressive nature of disease process.  Mady Haagensen, PT 07/02/22 2:17 PM Phone: [phone] Fax: [phone]   END OF SESSION:  PT End of Session - 07/02/22 0940     Visit Number 7    Number of Visits 17    Date for PT Re-Evaluation 08/02/22    Authorization Type UHC Medicare    PT Start Time 0938    PT Stop Time 1016    PT Time Calculation (min) 38 min    Activity Tolerance Patient tolerated treatment well    Behavior During Therapy WFL for tasks assessed/performed              Past Medical History:  Diagnosis Date   Arthritis    Breast cancer ([place])    stage I left   Bundle branch block    Bursitis    Cancer ([place])    face   Chronic cystitis    Chronic pain    Coronary artery disease    Degenerative disc disease, lumbar    Family history of adverse reaction to anesthesia    pt states her son has to have large amount of anesthesia - difficulty to go under    Family history of breast cancer    Family history of pancreatic cancer 12/08/2019   GERD (gastroesophageal reflux disease)    Hemorrhoids    Hepatitis A 1976   History of measles    History of mumps    Malaria    Malaria    Menopause     Nocturia    Osteopenia    Osteoporosis    Personal history of malignant neoplasm of breast    PONV (postoperative nausea and vomiting)    Urinary tract infection    Varicose veins    Past Surgical History:  Procedure Laterality Date   BARIATRIC SURGERY N/A    BREAST LUMPECTOMY  02/2010   left lumpectomy and sentinel node   SKIN CANCER DESTRUCTION     Nose cryotherapy   TOTAL HIP ARTHROPLASTY Right 06/14/2015   Procedure: RIGHT TOTAL HIP ARTHROPLASTY ANTERIOR APPROACH;  Surgeon: Gaynelle Arabian, MD;  Location: [facility];  Service: Orthopedics;  Laterality: Right;   VAGINAL HYSTERECTOMY  may 2008   and vaginal repair   Patient Active Problem List   Diagnosis Date Noted   Genetic testing 01/10/2020   Family history of pancreatic cancer 12/08/2019   Family history of breast cancer    Personal history of malignant neoplasm of breast    OA (osteoarthritis) of hip 06/14/2015   Preoperative cardiovascular examination 04/18/2015   Degenerative joint disease (DJD) of hip 04/18/2015   Abnormal EKG 04/18/2015   Incomplete RBBB 04/18/2015   Osteopenia 06/21/2014   Nausea and vomiting 01/23/2013   UTI (urinary tract infection)  01/23/2013   Chest pain 01/22/2013   Breast cancer, left breast ([place]) 01/14/2011    ONSET DATE: 05/29/2022 (MD referral)  REFERRING DIAG: G20.C (ICD-10-CM) - Parkinsonism, unspecified Parkinsonism type  THERAPY DIAG:  Unsteadiness on feet  Other abnormalities of gait and mobility  Abnormal posture  Muscle weakness (generalized)  Rationale for Evaluation and Treatment: Rehabilitation  SUBJECTIVE:                                                                                                                                                                                             SUBJECTIVE STATEMENT: Today's been a slow day, but I'm dong my exercises and using the stationary bike for about 10 minutes.  Feel like therapy has helped to make me more steady.    Pt accompanied by: self  PERTINENT HISTORY: Arthritis, breast Ca, CAD, lumbar DDD, GERD, osteoporosis, R THA (anterior) 2017  PAIN:  Are you having pain? No  Typical arthritis pain in back PRECAUTIONS: Fall  WEIGHT BEARING RESTRICTIONS: No  FALLS: Has patient fallen in last 6 months? Yes. Number of falls at least 2 No injuries, able to get up from the floor.  LIVING ENVIRONMENT: Lives with: lives with their spouse Lives in: House/apartment Stairs: Yes: External: 3 steps; on right going up and on left going up Has following equipment at home: Single point cane and None Cares for husband with dementia  PLOF: Independent Does ROM exercises for legs at home and previous ex from hip surgery.  Enjoys gardening.  PATIENT GOALS: Goals are for balance  OBJECTIVE:     TODAY'S TREATMENT: 07/01/2022 Activity Comments  5x sit<>stand:  11.16, 10.75 Initial trial, pt has some posterior pull with attempts at sit<>stand; cues provided for increased forward lean  TUG:  10.87 sec TUG cognitive:  10.34 sec Improved from TUG 12.43, cog 13.97  Gait velocity: 10.07 sec = 3.26 ft/sec Improved from 3.06 ft/sec  MiniBESTest:  23/28 Improved from 20/28         PATIENT EDUCATION: Education details: PT progress towards goals, POC and plans for d/c this visit; Parkinson's community resources, return PD screens  Person educated: Patient Education method: Consulting civil engineer, Demonstration, and Handouts Education comprehension: verbalized understanding and returned demonstration   --------------------------------------------------------------------------- (Objective measures below taken at initial evaluation):  DIAGNOSTIC FINDINGS: MRI scheduled in March  COGNITION: Overall cognitive status: Within functional limits for tasks assessed   SENSATION: Light touch: WFL   POSTURE: rounded shoulders  LOWER EXTREMITY ROM:   WFL   LOWER EXTREMITY MMT:    MMT Right Eval Left Eval  Hip flexion 4  4  Hip extension  Hip abduction    Hip adduction    Hip internal rotation    Hip external rotation    Knee flexion 4 4  Knee extension 4 4  Ankle dorsiflexion 3+ 4  Ankle plantarflexion    Ankle inversion    Ankle eversion    (Blank rows = not tested)    TRANSFERS: Assistive device utilized: None  Sit to stand: SBA Stand to sit: SBA  GAIT: Gait pattern: step through pattern, decreased step length- Right, decreased step length- Left, and narrow BOS Distance walked: 50 ft x 2 Assistive device utilized: None Level of assistance: Modified independence and SBA  FUNCTIONAL TESTS:  5 times sit to stand: 9.97 sec with posterior lean Timed up and go (TUG): 12.43 sec 10 meter walk test: 10.72 sec (3.06 ft/sec) TUG cognitive: 13.97 sec MiniBEST: 20/28 (scores <20/28 indicate increased fall risk TUG manual:  11.97 sec 360 degree turn to R:  9 steps, to L:  9 10 M fast:  8.25 sec (3.98 ft/sec) 31M back:  10.12 sec (0.99 ft/sec)     GOALS: Goals reviewed with patient? Yes  SHORT TERM GOALS: Target date: 07/05/2022  Pt will be independent with HEP for improved balance, transfers, gait. Baseline: Goal status: GOAL MET  2. Pt/family will verbalize understanding of local community PD resources, including community fitness. Baseline:  Goal status: GOAL MET  LONG TERM GOALS: Target date: 08/02/2022  Pt will be independent with HEP for improved balance, transfers, gait. Baseline:  Goal status: GOAL MET, 07/01/2022  2.  Pt will improve TUG/TUG cognitive score to less than or equal to 10% difference for decreased fall risk.  Baseline: 12.43 sec/13.97 sec Goal status: GOAL MET, 07/01/2022  3.  Pt will improve MiniBESTest score to at least 23/28 to decrease fall risk. Baseline: 20/28 Goal status: GOAL MET, 07/01/2022  ASSESSMENT:  CLINICAL IMPRESSION: Pt presents to OPPT today reporting she feels her balance is better and she feels good knowing what to do to prevent falls.   She feels she is ready to continue her exercises at home-she is doing HEP, stationary bike, plus community aquatics classes.  She has met all STGs and all LTGs.  She appears to be ready for d/c from PT today, and is agreeable to follow up speech, PT, OT screens in 6-9 months.  OBJECTIVE IMPAIRMENTS: Abnormal gait, decreased balance, decreased mobility, difficulty walking, decreased strength, and postural dysfunction.   ACTIVITY LIMITATIONS: standing, transfers, locomotion level, and caring for others  PARTICIPATION LIMITATIONS: community activity  PERSONAL FACTORS: Time since onset of injury/illness/exacerbation and 3+ comorbidities: recent dx of PD; see PMH above  are also affecting patient's functional outcome.   REHAB POTENTIAL: Good  CLINICAL DECISION MAKING: Evolving/moderate complexity  EVALUATION COMPLEXITY: Moderate  PLAN:  PT FREQUENCY: 2x/week  PT DURATION: 8 weeks plus eval  PLANNED INTERVENTIONS: Therapeutic exercises, Therapeutic activity, Neuromuscular re-education, Balance training, Gait training, Patient/Family education, and Self Care  PLAN FOR NEXT SESSION: Discharge PT.  Plan for PT, OT, speech screens in 6-9 months.  Frazier Butt., PT 07/02/2022, 2:16 PM  [facility] at [facility] [place], [place], [place] [address] Phone # [phone] Fax # [phone]

## 2022-07-12 ENCOUNTER — Ambulatory Visit
Admission: RE | Admit: 2022-07-12 | Discharge: 2022-07-12 | Disposition: A | Discharge status: Home / Self Care | Payer: Medicare Other | Source: Ambulatory Visit | Attending: Family Medicine | Admitting: Family Medicine

## 2022-07-12 DIAGNOSIS — Z1231 Encounter for screening mammogram for malignant neoplasm of breast: Secondary | ICD-10-CM

## 2022-07-30 ENCOUNTER — Telehealth: Payer: Self-pay | Admitting: Diagnostic Neuroimaging

## 2022-07-30 ENCOUNTER — Encounter: Payer: Self-pay | Admitting: Diagnostic Neuroimaging

## 2022-07-30 NOTE — Telephone Encounter (Signed)
Sent letter in mail informing pt of appointment change for 6/20 to 6/24 due to provider template change

## 2022-09-26 ENCOUNTER — Ambulatory Visit: Payer: Medicare Other | Admitting: Diagnostic Neuroimaging

## 2022-09-30 ENCOUNTER — Encounter: Payer: Self-pay | Admitting: Diagnostic Neuroimaging

## 2022-09-30 ENCOUNTER — Ambulatory Visit: Payer: Medicare Other | Admitting: Diagnostic Neuroimaging

## 2022-09-30 VITALS — BP 132/78 | HR 88 | Ht 61.0 in | Wt 148.6 lb

## 2022-09-30 DIAGNOSIS — G20C Parkinsonism, unspecified: Secondary | ICD-10-CM | POA: Diagnosis not present

## 2022-09-30 MED ORDER — CARBIDOPA-LEVODOPA 25-100 MG PO TABS: 1.0000 | ORAL_TABLET | Freq: Three times a day (TID) | ORAL | 4 refills | Status: DC

## 2022-09-30 NOTE — Patient Instructions (Signed)
  PARKINSONISM (good response to carb/levo) - check MRI brain (rule out other causes) - continue PT exercises (for gait training) - continue carbidopa / levodopa (25/100) 1 tab three times a day with meals

## 2022-09-30 NOTE — Progress Notes (Signed)
[facility]  PATIENT: Anne Obrien DOB: 08-Feb-1941  REFERRING CLINICIAN: Sigmund Hazel, MD HISTORY FROM: PATIENT, daughter REASON FOR VISIT: NEW CONSULT   HISTORICAL  CHIEF COMPLAINT:  Chief Complaint  Patient presents with   Follow-up    Rm 6, here with husband Algernon Huxley  Follow up on Parkinson's, states is doing all exercises. Doing well overall.     HISTORY OF PRESENT ILLNESS:   UPDATE (09/30/22, VRP): Since last visit, doing well!. Symptoms are improved. Now on carb/levo aand very active with exercises (PT, water, stationary bike).   PRIOR HPI: 82 year old female here for evaluation of tremor.  For past 2 years has noticed some tremor in left thumb.  She also has very minor tremor in his right thumb.  Has had some issues with decreased sleep and anxiety.  Also having worsening gait and balance.  Patient lives at home and is primary caregiver for her husband who has dementia.  They have a daughter also stays with them.  A different daughter is here for this visit today.  This daughter is a Engineer, civil (consulting) at the emergency room.   REVIEW OF SYSTEMS: Full 14 system review of systems performed and negative with exception of: as per HPI.   ALLERGIES: Allergies  Allergen Reactions   Augmentin [Amoxicillin-Pot Clavulanate] Diarrhea    HOME MEDICATIONS: Outpatient Medications Prior to Visit  Medication Sig Dispense Refill   Ascorbic Acid (VITAMIN C) 500 MG CAPS Take 500 mg by mouth daily.     b complex vitamins capsule Take 1 capsule by mouth daily.     Cholecalciferol (VITAMIN D3) 125 MCG (5000 UT) TABS Take 1 tablet by mouth daily.     Glucosamine 500 MG CAPS Take 500 mg by mouth in the morning, at noon, and at bedtime.     Magnesium 250 MG TABS Take 250 mg by mouth daily.     Omega-3 Fatty Acids (FISH OIL) 1000 MG CAPS Take 1,000 mg by mouth daily.     Zinc 100 MG TABS Take 100 mg by mouth daily.     carbidopa-levodopa (SINEMET IR) 25-100 MG tablet Take 1  tablet by mouth 3 (three) times daily before meals. 90 tablet 6   acetaminophen (TYLENOL) 325 MG tablet Take 650 mg by mouth every 6 (six) hours as needed for moderate pain. (Patient not taking: Reported on 09/30/2022)     No facility-administered medications prior to visit.    PAST MEDICAL HISTORY: Past Medical History:  Diagnosis Date   Arthritis    Breast cancer (HCC)    stage I left   Bundle branch block    Bursitis    Cancer (HCC)    face   Chronic cystitis    Chronic pain    Coronary artery disease    Degenerative disc disease, lumbar    Family history of adverse reaction to anesthesia    pt states her son has to have large amount of anesthesia - difficulty to go under    Family history of breast cancer    Family history of pancreatic cancer 12/08/2019   GERD (gastroesophageal reflux disease)    Hemorrhoids    Hepatitis A 1976   History of measles    History of mumps    Malaria    Malaria    Menopause    Nocturia    Osteopenia    Osteoporosis    Personal history of malignant neoplasm of breast    PONV (postoperative nausea and vomiting)  Urinary tract infection    Varicose veins     PAST SURGICAL HISTORY: Past Surgical History:  Procedure Laterality Date   BARIATRIC SURGERY N/A    BREAST LUMPECTOMY  02/2010   left lumpectomy and sentinel node   SKIN CANCER DESTRUCTION     Nose cryotherapy   TOTAL HIP ARTHROPLASTY Right 06/14/2015   Procedure: RIGHT TOTAL HIP ARTHROPLASTY ANTERIOR APPROACH;  Surgeon: Ollen Gross, MD;  Location: [facility];  Service: Orthopedics;  Laterality: Right;   VAGINAL HYSTERECTOMY  may 2008   and vaginal repair    FAMILY HISTORY: Family History  Problem Relation Age of Onset   Breast cancer Mother 75   Arthritis Mother    Pernicious anemia Father    Arthritis Sister    Arthritis Brother    Aneurysm Brother    Breast cancer Maternal Grandmother 69   Breast cancer Maternal Aunt 55   Cancer Maternal Uncle        NOS   Diabetes  Paternal Uncle    Stroke Paternal Grandfather    Aneurysm Maternal Aunt    Breast cancer Cousin        mat first cousin   Ovarian cancer Cousin        mat first cousin   Cancer Maternal Uncle        blood cancer    SOCIAL HISTORY: Social History   Socioeconomic History   Marital status: Married    Spouse name: Not on file   Number of children: Not on file   Years of education: Not on file   Highest education level: Not on file  Occupational History   Not on file  Tobacco Use   Smoking status: Never   Smokeless tobacco: Never  Substance and Sexual Activity   Alcohol use: No   Drug use: No   Sexual activity: Yes    Birth control/protection: Post-menopausal, Surgical  Other Topics Concern   Not on file  Social History Narrative   Not on file   Social Determinants of Health   Financial Resource Strain: Not on file  Food Insecurity: Not on file  Transportation Needs: Not on file  Physical Activity: Not on file  Stress: Not on file  Social Connections: Not on file  Intimate Partner Violence: Not on file     PHYSICAL EXAM  GENERAL EXAM/CONSTITUTIONAL: Vitals:  Vitals:   09/30/22 1345  BP: 132/78  Pulse: 88  Weight: 148 lb 9.6 oz (67.4 kg)  Height: 5\' 1"  (1.549 m)   Body mass index is 28.08 kg/m. Wt Readings from Last 3 Encounters:  09/30/22 148 lb 9.6 oz (67.4 kg)  05/29/22 154 lb (69.9 kg)  06/14/15 151 lb (68.5 kg)   Patient is in no distress; well developed, nourished and groomed; neck is supple  CARDIOVASCULAR: Examination of carotid arteries is normal; no carotid bruits Regular rate and rhythm, no murmurs Examination of peripheral vascular system by observation and palpation is normal  EYES: Ophthalmoscopic exam of optic discs and posterior segments is normal; no papilledema or hemorrhages No results found.  MUSCULOSKELETAL: Gait, strength, tone, movements noted in Neurologic exam below  NEUROLOGIC: MENTAL STATUS:      No data to display          awake, alert, oriented to person, place and time recent and remote memory intact normal attention and concentration language fluent, comprehension intact, naming intact fund of knowledge appropriate  CRANIAL NERVE:  2nd - no papilledema on fundoscopic exam 2nd, 3rd, 4th,  6th - pupils equal and reactive to light, visual fields full to confrontation, extraocular muscles intact, no nystagmus 5th - facial sensation symmetric 7th - facial strength symmetric 8th - hearing intact 9th - palate elevates symmetrically, uvula midline 11th - shoulder shrug symmetric 12th - tongue protrusion midline  MOTOR:  No tremor No rigidity No bradykinesia normal bulk and tone, full strength in the BUE, BLE  SENSORY:  normal and symmetric to light touch  COORDINATION:  finger-nose-finger, fine finger movements normal  REFLEXES:  deep tendon reflexes 1+ and symmetric  GAIT/STATION:  narrow based gait; good arm swing, stride and turning     DIAGNOSTIC DATA (LABS, IMAGING, TESTING) - I reviewed patient records, labs, notes, testing and imaging myself where available.  Lab Results  Component Value Date   WBC 19.9 (H) 06/16/2015   HGB 10.4 (L) 06/16/2015   HCT 31.6 (L) 06/16/2015   MCV 89.3 06/16/2015   PLT 165 06/16/2015      Component Value Date/Time   NA 144 06/16/2015 0339   NA 140 06/07/2015 1414   K 4.7 06/16/2015 0339   K 4.2 06/07/2015 1414   CL 109 06/16/2015 0339   CL 107 09/15/2012 1236   CO2 28 06/16/2015 0339   CO2 27 06/07/2015 1414   GLUCOSE 135 (H) 06/16/2015 0339   GLUCOSE 94 06/07/2015 1414   GLUCOSE 117 (H) 09/15/2012 1236   BUN 13 06/16/2015 0339   BUN 18.5 06/07/2015 1414   CREATININE 0.55 06/16/2015 0339   CREATININE 1.1 06/07/2015 1414   CALCIUM 9.1 06/16/2015 0339   CALCIUM 8.8 06/07/2015 1414   PROT 6.4 06/07/2015 1414   ALBUMIN 3.5 06/07/2015 1414   AST 16 06/07/2015 1414   ALT <9 06/07/2015 1414   ALKPHOS 54 06/07/2015 1414   BILITOT  0.35 06/07/2015 1414   GFRNONAA >60 06/16/2015 0339   GFRAA >60 06/16/2015 0339   No results found for: "CHOL", "HDL", "LDLCALC", "LDLDIRECT", "TRIG", "CHOLHDL" No results found for: "HGBA1C" No results found for: "VITAMINB12" No results found for: "TSH"   06/17/22 MRI brain  - normal   ASSESSMENT AND PLAN  82 y.o. year old female here with gradual progressive resting tremor, bradykinesia, cogwheel rigidity, gait difficulty, signs symptoms most consistent with idiopathic Parkinson disease.   Dx:  1. Parkinsonism, unspecified Parkinsonism type      PLAN:  PARKINSONISM (good response to carb/levo) - check MRI brain (rule out other causes) - continue PT exercises (for gait training) - continue carbidopa / levodopa (25/100) 1 tab three times a day with meals   Meds ordered this encounter  Medications   carbidopa-levodopa (SINEMET IR) 25-100 MG tablet    Sig: Take 1 tablet by mouth 3 (three) times daily before meals.    Dispense:  270 tablet    Refill:  4   Return in about 8 months (around 06/02/2023).    Suanne Marker, MD 09/30/2022, 2:05 PM Certified in Neurology, Neurophysiology and Neuroimaging  [facility] [address] [place], [place] [address] [phone]

## 2023-01-13 DIAGNOSIS — H40013 Open angle with borderline findings, low risk, bilateral: Secondary | ICD-10-CM | POA: Diagnosis not present

## 2023-01-13 DIAGNOSIS — H04123 Dry eye syndrome of bilateral lacrimal glands: Secondary | ICD-10-CM | POA: Diagnosis not present

## 2023-01-13 DIAGNOSIS — H524 Presbyopia: Secondary | ICD-10-CM | POA: Diagnosis not present

## 2023-01-13 DIAGNOSIS — H26491 Other secondary cataract, right eye: Secondary | ICD-10-CM | POA: Diagnosis not present

## 2023-02-21 DIAGNOSIS — L820 Inflamed seborrheic keratosis: Secondary | ICD-10-CM | POA: Diagnosis not present

## 2023-02-21 DIAGNOSIS — L570 Actinic keratosis: Secondary | ICD-10-CM | POA: Diagnosis not present

## 2023-02-21 DIAGNOSIS — D1801 Hemangioma of skin and subcutaneous tissue: Secondary | ICD-10-CM | POA: Diagnosis not present

## 2023-02-21 DIAGNOSIS — L821 Other seborrheic keratosis: Secondary | ICD-10-CM | POA: Diagnosis not present

## 2023-03-20 ENCOUNTER — Telehealth: Payer: Self-pay

## 2023-03-20 ENCOUNTER — Ambulatory Visit: Payer: Medicare Other

## 2023-03-20 ENCOUNTER — Ambulatory Visit: Payer: Medicare Other | Admitting: Occupational Therapy

## 2023-03-20 ENCOUNTER — Ambulatory Visit: Payer: Medicare Other | Attending: Diagnostic Neuroimaging | Admitting: Physical Therapy

## 2023-03-20 DIAGNOSIS — R29818 Other symptoms and signs involving the nervous system: Secondary | ICD-10-CM | POA: Insufficient documentation

## 2023-03-20 DIAGNOSIS — R471 Dysarthria and anarthria: Secondary | ICD-10-CM

## 2023-03-20 DIAGNOSIS — G20A1 Parkinson's disease without dyskinesia, without mention of fluctuations: Secondary | ICD-10-CM

## 2023-03-20 DIAGNOSIS — R2689 Other abnormalities of gait and mobility: Secondary | ICD-10-CM | POA: Insufficient documentation

## 2023-03-20 NOTE — Therapy (Signed)
[place] [facility] [address] [place], [place], [address] Phone: [phone]   Fax:  [phone]  Patient Details  Name: Anne Obrien MRN: [id number] Date of Birth: [birth year] Patient's MD for Parkinson's: V. Penumalli, MD  Encounter Date: 03/20/2023  Speech Therapy Parkinson's Disease Screen   Average decibel level today in 5 minutes of conversation: 64dB  (WNL=70-72 dB) with sound level meter 30cm away from pt's mouth. Pt's conversational volume is considerably lower than normal range.   Research states that low voice volume ("hypophonia") is a symptom of Parkinson's Disease. Evidence-based treatment programs used in our clinic have been proven to increase speaking volume for patients with Parkinson's Disease.  Based upon these results, [name] was interested in a course of speech therapy and she would benefit from speech-language eval for dysarthria - please order if agreed.  Pt does not report difficulty with swallowing, which does not warrant further evaluation   [name], CCC-SLP 03/20/2023, 11:45 AM  [place] [facility] [address] [place], [place], [address] Phone: [phone]   Fax:  [phone]

## 2023-03-20 NOTE — Addendum Note (Signed)
Addended by: Joycelyn Schmid R on: 03/20/2023 01:08 PM   Modules accepted: Orders

## 2023-03-20 NOTE — Therapy (Signed)
[place] [facility] [address] [place], [place], [address] Phone: [phone]   Fax:  [phone]  Patient Details  Name: Anne Obrien MRN: [id number] Date of Birth: March 21, 1941 Referring Provider:  Sigmund Hazel, MD  Encounter Date: 03/20/2023  Occupational Therapy Parkinson's Disease Screen  Hand dominance:  right   Handwriting: PPT #1: 11.97 sec with 100% legibility and no micrographia  Physical Performance Test item #2 (simulated eating):  15.19 sec  Physical Performance Test item #4 (donning/doffing jacket):  12.21 sec  Fastening/unfastening 3 buttons in:  16.16 sec  9-hole peg test:    RUE  29.22 sec        LUE  31.44 sec  Change in ability to perform ADLs/IADLs:  reports no difficulties with self-care tasks  Other Comments:  "I'm glad the medicine works."  Pt does not require occupational therapy services at this time.  Recommended occupational therapy screen in   6-8 months   Luther, [place], OT 03/20/2023, 11:24 AM  [place] [facility] [address] [place], [place], [address] Phone: [phone]   Fax:  [phone]

## 2023-03-20 NOTE — Therapy (Signed)
[place] [facility] [address] [place], [place], [address] Phone: [phone]   Fax:  [phone]  Patient Details  Name: Anne Obrien MRN: [id number] Date of Birth: 1940/05/12 Referring Provider:  Sigmund Hazel, MD  Encounter Date: 03/20/2023  Physical Therapy Parkinson's Disease Screen   Timed Up and Go test:11.04 sec (compared to 10.87 sec)  10 meter walk test: 10.44 sec = 3.14 ft/sec (compared to 3.26 ft/sec)  5 time sit to stand test:11.12 sec (compared to 10.75 sec)  Patient does not require Physical Therapy services at this time.  Recommend Physical Therapy screen in 6-9 months.  Pt reports going to [employer] for exercise 2x/wk.  Have had at least one fall.    Iram Astorino W., PT 03/20/2023, 8:18 AM  [place] [facility] [address] [place], [place], [address] Phone: [phone]   Fax:  [phone]

## 2023-03-20 NOTE — Telephone Encounter (Signed)
Orders Placed This Encounter  Procedures   Ambulatory referral to Speech Therapy   Suanne Marker, MD 03/20/2023, 1:08 PM Certified in Neurology, Neurophysiology and Neuroimaging  [facility] [address], [address] [place], [place] [address] [phone]

## 2023-03-20 NOTE — Telephone Encounter (Signed)
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   Her average loudness in 6 minutes of conversation was 64dB which is considerably lower than normal range (WNL=70-72 dB). Based upon these results she would benefit from, and Jere was interested in, a course of speech therapy - please order if agreed. Evidence-based treatment programs used in our clinic have been proven to increase speaking volume for patients with Parkinson's Disease.  Thank you. Verdie Mosher, MS, SLP  [facility] [address] [place], [address] [place], [place] [address]  [phone]

## 2023-03-27 ENCOUNTER — Other Ambulatory Visit: Payer: Self-pay

## 2023-03-27 ENCOUNTER — Ambulatory Visit: Payer: Medicare Other

## 2023-03-27 DIAGNOSIS — R471 Dysarthria and anarthria: Secondary | ICD-10-CM | POA: Diagnosis not present

## 2023-03-27 DIAGNOSIS — R2689 Other abnormalities of gait and mobility: Secondary | ICD-10-CM | POA: Diagnosis not present

## 2023-03-27 DIAGNOSIS — R29818 Other symptoms and signs involving the nervous system: Secondary | ICD-10-CM | POA: Diagnosis not present

## 2023-03-27 NOTE — Therapy (Signed)
OUTPATIENT SPEECH LANGUAGE PATHOLOGY PARKINSON'S EVALUATION   Patient Name: Anne Obrien MRN: [id number] DOB:06-18-1940, 82 y.o., female Today's Date: 03/27/2023  PCP: Sigmund Hazel, MD REFERRING PROVIDER: Etheleen Mayhew MD  END OF SESSION:  End of Session - 03/27/23 1708     Visit Number 1    Number of Visits 17    Date for SLP Re-Evaluation 05/30/23    SLP Start Time 1535    SLP Stop Time  1617    SLP Time Calculation (min) 42 min    Activity Tolerance Patient tolerated treatment well             Past Medical History:  Diagnosis Date   Arthritis    Breast cancer (HCC)    stage I left   Bundle branch block    Bursitis    Cancer (HCC)    face   Chronic cystitis    Chronic pain    Coronary artery disease    Degenerative disc disease, lumbar    Family history of adverse reaction to anesthesia    pt states her son has to have large amount of anesthesia - difficulty to go under    Family history of breast cancer    Family history of pancreatic cancer 12/08/2019   GERD (gastroesophageal reflux disease)    Hemorrhoids    Hepatitis A 1976   History of measles    History of mumps    Malaria    Malaria    Menopause    Nocturia    Osteopenia    Osteoporosis    Personal history of malignant neoplasm of breast    PONV (postoperative nausea and vomiting)    Urinary tract infection    Varicose veins    Past Surgical History:  Procedure Laterality Date   BARIATRIC SURGERY N/A    BREAST LUMPECTOMY  02/2010   left lumpectomy and sentinel node   SKIN CANCER DESTRUCTION     Nose cryotherapy   TOTAL HIP ARTHROPLASTY Right 06/14/2015   Procedure: RIGHT TOTAL HIP ARTHROPLASTY ANTERIOR APPROACH;  Surgeon: Ollen Gross, MD;  Location: [facility];  Service: Orthopedics;  Laterality: Right;   VAGINAL HYSTERECTOMY  may 2008   and vaginal repair   Patient Active Problem List   Diagnosis Date Noted   Genetic testing 01/10/2020   Family history of pancreatic cancer  12/08/2019   Family history of breast cancer    Personal history of malignant neoplasm of breast    OA (osteoarthritis) of hip 06/14/2015   Preoperative cardiovascular examination 04/18/2015   Degenerative joint disease (DJD) of hip 04/18/2015   Abnormal EKG 04/18/2015   Incomplete RBBB 04/18/2015   Osteopenia 06/21/2014   Nausea and vomiting 01/23/2013   UTI (urinary tract infection) 01/23/2013   Chest pain 01/22/2013   Breast cancer, left breast (HCC) 01/14/2011    ONSET DATE: approx one year ago - script  03/20/23  REFERRING DIAG:  G20.A1 (ICD-10-CM) - Parkinson's disease without dyskinesia or fluctuating manifestations (HCC)      THERAPY DIAG:  Dysarthria and anarthria - Plan: SLP plan of care cert/re-cert  Rationale for Evaluation and Treatment: Rehabilitation  SUBJECTIVE:   SUBJECTIVE STATEMENT: "My husband can't hear me all the time but his hearing is not good." Pt accompanied by: self  PERTINENT HISTORY: Arthritis, breast Ca, CAD, lumbar DDD, GERD, osteoporosis, R THA (anterior) 2017   PAIN:  Are you having pain? Yes: NPRS scale: 2/10 Pain location: lower back Pain description: sore Aggravating factors: bending Relieving  factors: heat, ice  FALLS: Has patient fallen in last 6 months?  Yes, Number of falls: 3  LIVING ENVIRONMENT: Lives with: lives with their spouse Lives in: House/apartment  PLOF:  Level of assistance: Independent with ADLs, Independent with IADLs Employment: Retired  PATIENT GOALS: Improve volume (after hearing recording)  OBJECTIVE:  Note: Objective measures were completed at Evaluation unless otherwise noted.  DIAGNOSTIC FINDINGS:  WNL MRI of brain (06/17/22)  COGNITION: Overall cognitive status: Within functional limits for tasks assessed  MOTOR SPEECH: Overall motor speech: impaired Level of impairment: Word, Phrase, Sentence, and Conversation Respiration: thoracic breathing Phonation: low vocal intensity Resonance:  WFL Articulation: Appears intact Intelligibility: Intelligible Motor planning: Appears intact Effective technique: increased vocal intensity  ORAL MOTOR EXAMINATION: Overall status: Impaired: Lingual: Bilateral (ROM and Coordination) Comments: ROM was better with mirror   OBJECTIVE VOICE ASSESSMENT: Sustained "ah" maximum phonation time: 12.9 seconds Sustained "ah" loudness average: 67 dB Oral reading (passage) loudness average: 65 dB Oral reading loudness range: 52-75 dB Conversational loudness average: 65 dB Conversational loudness range: 51-76 dB Voice quality: normal Stimulability trials: Given SLP modeling and occasional min cues, loudness average increased to 71dB (range of 56 to 85) at reading level.  Comments: Pt incr'd loudness to 70dB with 10 seconds of spontaneous speech with initial cue to "be intentional and deliberate with your speech" - she did exhibit loudness fade after 9 seconds.  Completed audio recording of patients baseline voice without cueing from SLP: Yes  Pt does not report difficulty with swallowing which does not warrant further evaluation.  PATIENT REPORTED OUTCOME MEASURES (PROM): Communication Effectiveness Survey: to be provided in first 1-2 sessions                                                                                                                            TREATMENT DATE:  SO=SPEAKOUT!, AB=Abdominal breathing  03/27/23: Education about [employer] video, rationale for speaking with Intent/SO. SLP assisted pt with signing up for SO workbook today in session. SLP shaped pt's loud /a/, and provided training on how to read street signs and bumper stickers while driving as practice.   PATIENT EDUCATION: Education details: See "treatment date" for today Person educated: Patient Education method: Explanation, Demonstration, Verbal cues, and Handouts Education comprehension: verbalized understanding, returned  demonstration, verbal cues required, and needs further education  HOME EXERCISE PROGRAM: 5 loud /a/, BID, read street signs and bumper stickers with an intentional voice   GOALS: Goals reviewed with patient? No  SHORT TERM GOALS: Target date: 05/02/23  Pt will complete SO homework for 4 consecutive days as indicated by practice log in SO workbook Baseline: Goal status: INITIAL  2.  Pt will generate WNL volume in SO cognitive tasks in 5 sessions Baseline:  Goal status: INITIAL  3.  Anne Obrien will answer questions with WNL volume in 3 sessions Baseline:  Goal status: INITIAL  4.  Pt will demo AB 80% of the time  in sentence responses in 3 sessions Baseline:  Goal status: INITIAL   LONG TERM GOALS: Target date: 05/30/23  Pt will improve PROM compared to initial administration Baseline:  Goal status: INITIAL  2.  Pt will demo AB 80% of the time in 8 minutes conversation in 3 sessions Baseline:  Goal status: INITIAL  3.  Pt will complete SO homework for 7 consecutive days as indicated by practice log in SO workbook Baseline:  Goal status: INITIAL  4.  Pt will generate WNL volume in SO cognitive tasks in 5 sessions, after 05/02/23 Baseline:  Goal status: INITIAL  5.  Anne Obrien will produce WNL volume in 8 minutes conversation, in 3 sessions Baseline:  Goal status: INITIAL  ASSESSMENT:  CLINICAL IMPRESSION: Patient is a 82 y.o. F who was seen today for assessment of speech volume/dysarthria  in light of PD. Her habitual volume is 65dB in conversation, which is below WNL volume of 70-72. Anne Obrien states her husband has difficulty understanding her - which pt attributed to his hearing loss until she heard a recording of her voice today and told SLP her voice was easier to hear when she read with INTENT. Pt should be an excellent candidate for benefiting from ST.  OBJECTIVE IMPAIRMENTS: Objective impairments include dysarthria. These impairments are limiting patient from household  responsibilities, ADLs/IADLs, and effectively communicating at home and in community.Factors affecting potential to achieve goals and functional outcome are  none noted today .Marland Kitchen Patient will benefit from skilled SLP services to address above impairments and improve overall function.  REHAB POTENTIAL: Good  PLAN:  SLP FREQUENCY: 2x/week  SLP DURATION: 8 weeks   PLANNED INTERVENTIONS: 92507 Treatment of speech (30 or 45 min) , Environmental controls, Internal/external aids, SLP instruction and feedback, Compensatory strategies, and Patient/family education    [facility], CCC-SLP 03/27/2023, 5:09 PM

## 2023-03-27 NOTE — Patient Instructions (Signed)
   Go to "parkinson voice project" website  Scroll down until you see three videos, watch the one in the middle "What is Parkinson's" video  Do 5 purposeful and deliberate "ahhhhhh"s twice a day  Read signs, bumper stickers as you drive with INTENT and purpose - use your CEO, teaching voice! SPEAK OUT!

## 2023-04-08 DIAGNOSIS — R058 Other specified cough: Secondary | ICD-10-CM | POA: Diagnosis not present

## 2023-04-08 DIAGNOSIS — J069 Acute upper respiratory infection, unspecified: Secondary | ICD-10-CM | POA: Diagnosis not present

## 2023-05-27 DIAGNOSIS — Z0000 Encounter for general adult medical examination without abnormal findings: Secondary | ICD-10-CM | POA: Diagnosis not present

## 2023-05-27 DIAGNOSIS — M8588 Other specified disorders of bone density and structure, other site: Secondary | ICD-10-CM | POA: Diagnosis not present

## 2023-05-27 DIAGNOSIS — J4520 Mild intermittent asthma, uncomplicated: Secondary | ICD-10-CM | POA: Diagnosis not present

## 2023-05-27 DIAGNOSIS — E7800 Pure hypercholesterolemia, unspecified: Secondary | ICD-10-CM | POA: Diagnosis not present

## 2023-05-27 DIAGNOSIS — R7303 Prediabetes: Secondary | ICD-10-CM | POA: Diagnosis not present

## 2023-05-27 DIAGNOSIS — Z23 Encounter for immunization: Secondary | ICD-10-CM | POA: Diagnosis not present

## 2023-06-02 ENCOUNTER — Encounter: Payer: Medicare Other | Admitting: Diagnostic Neuroimaging

## 2023-06-02 ENCOUNTER — Encounter: Payer: Self-pay | Admitting: Diagnostic Neuroimaging

## 2023-06-02 DIAGNOSIS — Z0289 Encounter for other administrative examinations: Secondary | ICD-10-CM

## 2023-06-05 NOTE — Progress Notes (Signed)
 This encounter was created in error - please disregard.

## 2023-06-06 ENCOUNTER — Other Ambulatory Visit: Payer: Self-pay | Admitting: Family Medicine

## 2023-06-06 DIAGNOSIS — Z1231 Encounter for screening mammogram for malignant neoplasm of breast: Secondary | ICD-10-CM

## 2023-06-20 DIAGNOSIS — L570 Actinic keratosis: Secondary | ICD-10-CM | POA: Diagnosis not present

## 2023-06-25 DIAGNOSIS — B372 Candidiasis of skin and nail: Secondary | ICD-10-CM | POA: Diagnosis not present

## 2023-06-25 DIAGNOSIS — Z4689 Encounter for fitting and adjustment of other specified devices: Secondary | ICD-10-CM | POA: Diagnosis not present

## 2023-07-03 DIAGNOSIS — B372 Candidiasis of skin and nail: Secondary | ICD-10-CM | POA: Diagnosis not present

## 2023-07-03 DIAGNOSIS — Z4689 Encounter for fitting and adjustment of other specified devices: Secondary | ICD-10-CM | POA: Diagnosis not present

## 2023-07-06 DIAGNOSIS — U071 COVID-19: Secondary | ICD-10-CM | POA: Diagnosis not present

## 2023-07-06 DIAGNOSIS — Z20822 Contact with and (suspected) exposure to covid-19: Secondary | ICD-10-CM | POA: Diagnosis not present

## 2023-07-08 ENCOUNTER — Ambulatory Visit: Payer: Medicare Other | Admitting: Diagnostic Neuroimaging

## 2023-07-14 ENCOUNTER — Ambulatory Visit: Payer: Medicare Other

## 2023-07-16 DIAGNOSIS — H40013 Open angle with borderline findings, low risk, bilateral: Secondary | ICD-10-CM | POA: Diagnosis not present

## 2023-07-16 DIAGNOSIS — Z961 Presence of intraocular lens: Secondary | ICD-10-CM | POA: Diagnosis not present

## 2023-07-25 ENCOUNTER — Ambulatory Visit

## 2023-07-31 ENCOUNTER — Ambulatory Visit
Admission: RE | Admit: 2023-07-31 | Discharge: 2023-07-31 | Disposition: A | Discharge status: Home / Self Care | Source: Ambulatory Visit | Attending: Family Medicine | Admitting: Family Medicine

## 2023-07-31 DIAGNOSIS — Z1231 Encounter for screening mammogram for malignant neoplasm of breast: Secondary | ICD-10-CM | POA: Diagnosis not present

## 2023-09-16 DIAGNOSIS — M25511 Pain in right shoulder: Secondary | ICD-10-CM | POA: Diagnosis not present

## 2023-09-16 NOTE — Progress Notes (Signed)
 Patient Name: Anne Obrien MR#: [id number]   Subjective:   HPI (09/16/2023):  History of Present Illness The patient is an 83 year old female who presents for evaluation of right shoulder pain.  She reports experiencing pain in her right shoulder, which she has been managing with intermittent cold and warm compresses for approximately 20 minutes each. She also takes glucosamine, which she finds beneficial. The onset of the pain was around the end of 08/2023. She has not received any injections for this issue. She has resorted to using her left hand more frequently due to the discomfort. Her sleep is generally undisturbed, lasting about 4 hours, and she often takes an afternoon nap. She has a history of bone spur removal from the same shoulder. She has been engaging in boxing exercises at [employer], as recommended by a friend, but has noticed that it exacerbates her shoulder pain.  She was diagnosed with Parkinson's disease 1.5 years ago and has been incorporating exercise into her routine.  PAST SURGICAL HISTORY: Bone spur removal from the right shoulder.  SOCIAL HISTORY Exercise: Boxing on a bag for about half an hour to three-quarters of an hour.     BMI Readings from Last 1 Encounters:  09/16/23 28.34 kg/m   Objective:  Physical Exam:  Right shoulder: Skin intact, no lesions No significant atrophy or asymmetry PROM R   Flexion 150   ER 40   IR T10   Strength R   Abd 5   ER 5   IR 5   No pain with impingement signs SILT Ax/rad/med/ulnar + motor grip/EPL/IO Hand WWP, palpable radial pulse  Diagnostic Studies:   X-rays of the right shoulder reviewed and interpreted, demonstrating moderate degenerative changes with joint space loss and some subchondral sclerosis.  Possible changes in the humeral head related to previous shoulder surgery  Assessment:  83 y.o. female with right shoulder osteoarthritis  Medical Decision Making:  We reviewed her x-rays and discussed  her exam findings.  She has excellent passive range of motion, slightly limited and painful at terminal ranges consistent with osteoarthritis pain.  Her rotator cuff strength is excellent and she does not have impingement signs.  She was interested in a glenohumeral steroid injection for the pain today which we provided.  Happy to see her back as needed.  She did not express any interest in any future shoulder replacement  Plan of care:   Right shoulder intra-articular glenohumeral joint injection Follow up: When needed  Large joint arthrocentesis on 09/16/2023 9:20 AMIndications: pain Details: 21 G needle, posterior approach Medications: 8 mL BUPivacaine  HCl 0.25 % (2.5 mg/mL); 40 mg triamcinolone acetonide 40 mg/mL Outcome: tolerated well, no immediate complications Consent was given by the patient. Immediately prior to procedure a time out was called to verify the correct patient, procedure, equipment, support staff and site/side marked as required. Patient was prepped and draped in the usual sterile fashion.

## 2023-10-22 DIAGNOSIS — Z4689 Encounter for fitting and adjustment of other specified devices: Secondary | ICD-10-CM | POA: Diagnosis not present

## 2023-11-05 NOTE — Therapy (Signed)
 Physical Therapy Parkinson's Disease Screen  Patient denies any changes in balance/mobility. Reports 1 fall when she caught her foot in something. Reports that she has been trying to walk in the morning, doing the exercises from previous PT POC, water walking 2x/wk, and gym.    Timed Up and Go test:10.26 sec compared to 11.04 sec on 03/20/23  10 meter walk test:9.33 sec (3.5 ft/sec) compared to 3.14 ft/sec on 03/20/23  5 time sit to stand test:12.52 sec compared to 11.12 sec on 03/20/23   Patient does not require Physical Therapy services at this time.  Recommend Physical Therapy screen in 6 months.   Anne Obrien, PT, DPT 11/06/23 12:32 PM  [facility] [address] [place], [address] [place], [place] [address] Phone # [phone] Fax # [phone]

## 2023-11-06 ENCOUNTER — Telehealth: Payer: Self-pay

## 2023-11-06 ENCOUNTER — Ambulatory Visit: Payer: Medicare Other | Admitting: Occupational Therapy

## 2023-11-06 ENCOUNTER — Ambulatory Visit: Payer: Medicare Other | Attending: Diagnostic Neuroimaging | Admitting: Physical Therapy

## 2023-11-06 ENCOUNTER — Ambulatory Visit: Payer: Medicare Other

## 2023-11-06 DIAGNOSIS — R471 Dysarthria and anarthria: Secondary | ICD-10-CM

## 2023-11-06 DIAGNOSIS — R1310 Dysphagia, unspecified: Secondary | ICD-10-CM | POA: Insufficient documentation

## 2023-11-06 DIAGNOSIS — R29818 Other symptoms and signs involving the nervous system: Secondary | ICD-10-CM

## 2023-11-06 DIAGNOSIS — G20A1 Parkinson's disease without dyskinesia, without mention of fluctuations: Secondary | ICD-10-CM

## 2023-11-06 DIAGNOSIS — R2689 Other abnormalities of gait and mobility: Secondary | ICD-10-CM | POA: Insufficient documentation

## 2023-11-06 NOTE — Therapy (Signed)
 [facility] [address] [place], [place], [address] Phone: [phone]   Fax:  [phone]  Patient Details  Name: Anne Obrien MRN: [id number] Date of Birth: Jun 18, 1940 Referring Provider:  Cleotilde Planas, MD  Encounter Date: 11/06/2023  Occupational Therapy Parkinson's Disease Screen  Hand dominance:  right   Handwriting: PPT #1: 13.44 sec with 100% legibility and no micrographia   Physical Performance Test item #2 (simulated eating):  11.07 sec  Physical Performance Test item #4 (donning/doffing jacket):  9.13 sec  Fastening/unfastening 3 buttons in:  20.35 sec  9-hole peg test:    RUE  25.19 sec        LUE  30.33 sec  Change in ability to perform ADLs/IADLs:  reports no difficulty with self-care tasks.  Reports that she has been working on her buttons.   Pt does not require occupational therapy services at this time.  Recommended occupational therapy screen in   6-8 months    [place], [place], OT 11/06/2023, 11:13 AM  [facility] [place] [facility] [address] [place], [place], [address] Phone: [phone]   Fax:  [phone]

## 2023-11-06 NOTE — Therapy (Signed)
 [place] [facility] [address] [place], [place], [address] Phone: [phone]   Fax:  [phone]  Patient Details  Name: Anne Obrien MRN: [id number] Date of Birth: March 06, 1941 Patient's MD for Parkinson's: V. Penumalli, MD  Encounter Date: 11/06/2023  Speech Therapy Parkinson's Disease Screen   Average decibel level today in 5 minutes of conversation: 69dB  (WNL=70-72 dB) with sound level meter 30cm away from pt's mouth. Pt's conversational volume is lower than normal range. Pt would like to continue to work on her speech volume at home instead of a referral for ST at this time.  Pt does report difficulty with swallowing liquids multiple times per day, which does warrant further evaluation with a modified barium swallow examination.  Please order if agreed. Procedure code is [id number].   [name], CCC-SLP 11/06/2023, 11:54 AM  [place] [facility] [address] [place], [place], [address] Phone: [phone]   Fax:  [phone]

## 2023-11-06 NOTE — Telephone Encounter (Signed)
 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 I am recommending a modified barium swallow study for this patient due to pt-reported coughing with liquids multiple times a day.   Please order this exam if agreed. Procedure code is [id number]. Thank you.  Lupita Connor, MS, SLP   [facility] at [facility] [address] [place], [place] [address]  P: [phone] F: [phone]

## 2023-11-06 NOTE — Telephone Encounter (Signed)
 Order placed

## 2023-11-07 ENCOUNTER — Other Ambulatory Visit (HOSPITAL_COMMUNITY): Payer: Self-pay | Admitting: Diagnostic Neuroimaging

## 2023-11-07 DIAGNOSIS — R059 Cough, unspecified: Secondary | ICD-10-CM

## 2023-11-07 DIAGNOSIS — R1310 Dysphagia, unspecified: Secondary | ICD-10-CM

## 2023-11-21 ENCOUNTER — Ambulatory Visit (HOSPITAL_COMMUNITY)
Admission: RE | Admit: 2023-11-21 | Discharge: 2023-11-21 | Disposition: A | Discharge status: Home / Self Care | Source: Ambulatory Visit | Attending: Diagnostic Neuroimaging | Admitting: Diagnostic Neuroimaging

## 2023-11-21 ENCOUNTER — Ambulatory Visit (HOSPITAL_COMMUNITY)
Admission: RE | Admit: 2023-11-21 | Discharge: 2023-11-21 | Disposition: A | Discharge status: Home / Self Care | Source: Ambulatory Visit | Attending: *Deleted | Admitting: *Deleted

## 2023-11-21 DIAGNOSIS — R29818 Other symptoms and signs involving the nervous system: Secondary | ICD-10-CM | POA: Diagnosis not present

## 2023-11-21 DIAGNOSIS — F419 Anxiety disorder, unspecified: Secondary | ICD-10-CM | POA: Insufficient documentation

## 2023-11-21 DIAGNOSIS — G20A1 Parkinson's disease without dyskinesia, without mention of fluctuations: Secondary | ICD-10-CM | POA: Diagnosis not present

## 2023-11-21 DIAGNOSIS — G4700 Insomnia, unspecified: Secondary | ICD-10-CM | POA: Insufficient documentation

## 2023-11-21 DIAGNOSIS — K219 Gastro-esophageal reflux disease without esophagitis: Secondary | ICD-10-CM | POA: Diagnosis not present

## 2023-11-21 DIAGNOSIS — R059 Cough, unspecified: Secondary | ICD-10-CM

## 2023-11-21 DIAGNOSIS — R471 Dysarthria and anarthria: Secondary | ICD-10-CM | POA: Diagnosis not present

## 2023-11-21 DIAGNOSIS — R1310 Dysphagia, unspecified: Secondary | ICD-10-CM

## 2023-11-21 NOTE — Progress Notes (Signed)
 Modified Barium Swallow Study  Patient Details  Name: Anne Obrien MRN: [id number] Date of Birth: [birth year]  Today's Date: 11/21/2023  HPI/PMH: HPI: pt is an 83 yo female referred for OP MBS by her neuro MD and OP SLP. Pt has Parkinson's (diagnosed 1.5 years ago- symptomatic 3 years), breast cancer, measles, mumps, insomnia, anxiety, decreased gait and balance and cough not associated with po intake *per pt. Pt reports she was informed she likely has post nasal drainage - which she admits to but does not take medication or nasal saline for it.  She stays active including doing PT, boxing, water actitivies and biking.  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  Medication list includes carbidopa  levadopa. Pt admits to occasional issues with GERD but she does not take reflux medications.  She denies dysphagia to food, liquid or medications - but referral indicates pt coughs daily with liquids. Pt denies unintended weight loss, pulmonary infections nor requiring heimlich manuever.   Clinical Impression: Clinical Impression: Normal oropharyngeal swallow ability noted. Oral transiting was timely without difficulty initiating.  No aspiration or even penetration of any consistency tested. Pt's swallow was strong and timely.  Swallow triggered at pyriform sinus with liquids and over epiglottis with solids.  Barium tablet taken with thin easily transited through oropharynx.    PES timing and adequacy of opening was adequate.   Barium tablet taken with thin appeared to halt at LES - without pt awareness.   Pt did not cough nor clearing her throat during or after MBS.  She is noted to have baseline throat clearing which she attributes to post nasal drainage. She denies cough being related to po intake.   Reviewed results of MBS wit pt using teach back. Thanks for this consult.  Factors that may increase risk of adverse event in presence of aspiration Noe & Lianne 2021):  No data recorded  Recommendations/Plan: Swallowing Evaluation Recommendations Swallowing Evaluation Recommendations Recommendations: PO diet PO Diet Recommendation: Regular; Thin liquids (Level 0) Liquid Administration via: Cup; Straw Medication Administration: Whole meds with liquid Supervision: Patient able to self-feed Postural changes: Stay upright 30-60 min after meals Oral care recommendations: Oral care BID (2x/day)    Treatment Plan No data recorded   Recommendations Recommendations for follow up therapy are one component of a multi-disciplinary discharge planning process, led by the attending physician.  Recommendations may be updated based on patient status, additional functional criteria and insurance authorization.  Assessment: Orofacial Exam: Orofacial Exam Oral Cavity: Oral Hygiene: WFL    Anatomy:  Anatomy: WFL   Boluses Administered: Boluses Administered Boluses Administered: Thin liquids (Level 0); Mildly thick liquids (Level 2, nectar thick); Solid; Puree     Oral Impairment Domain: Oral Impairment Domain Lip Closure: No labial escape Tongue control during bolus hold: Cohesive bolus between tongue to palatal seal Bolus preparation/mastication: Timely and efficient chewing and mashing Bolus transport/lingual motion: Brisk tongue motion Oral residue: Trace residue lining oral structures Location of oral residue : Tongue Initiation of pharyngeal swallow : Pyriform sinuses; Posterior laryngeal surface of the epiglottis     Pharyngeal Impairment Domain: Pharyngeal Impairment Domain Soft palate elevation: No bolus between soft palate (SP)/pharyngeal wall (PW) Laryngeal elevation: Complete superior movement of thyroid  cartilage with complete approximation of arytenoids to epiglottic petiole Anterior hyoid excursion: Complete anterior movement Epiglottic movement: Complete inversion Laryngeal vestibule closure: Complete, no air/contrast in laryngeal  vestibule Pharyngeal stripping wave : Present - complete Pharyngeal contraction (A/P view only): N/A  Pharyngoesophageal segment opening: Complete distension and complete duration, no obstruction of flow Tongue base retraction: No contrast between tongue base and posterior pharyngeal wall (PPW) Pharyngeal residue: Complete pharyngeal clearance Location of pharyngeal residue: N/A     Esophageal Impairment Domain: Esophageal Impairment Domain Esophageal clearance upright position: Esophageal retention    Pill: Pill Consistency administered: Thin liquids (Level 0) Thin liquids (Level 0): [facility]    Penetration/Aspiration Scale Score: Penetration/Aspiration Scale Score 1.  Material does not enter airway: Thin liquids (Level 0); Mildly thick liquids (Level 2, nectar thick); Puree; Solid; Pill    Compensatory Strategies: Compensatory Strategies Compensatory strategies: No       General Information: Caregiver present: No   Diet Prior to this Study: Regular; Thin liquids (Level 0)    Temperature : Normal    Respiratory Status: WFL    Supplemental O2: None (Room air)    History of Recent Intubation: No   Behavior/Cognition: Alert; Cooperative; Pleasant mood  Self-Feeding Abilities: Able to self-feed  Baseline vocal quality/speech: Hypophonia/low volume (slight)  Volitional Cough: Able to elicit  Volitional Swallow: Able to elicit  Exam Limitations: No limitations   Goal Planning: No data recorded No data recorded No data recorded No data recorded No data recorded  Pain: Pain Assessment Pain Assessment: No/denies pain    End of Session: Start Time:SLP Start Time (ACUTE ONLY): 1315  Stop Time: SLP Stop Time (ACUTE ONLY): 1340  Time Calculation:SLP Time Calculation (min) (ACUTE ONLY): 25 min  Charges: SLP Evaluations $ SLP Speech Visit: 1 Visit  SLP Evaluations $Outpatient MBS Swallow: 1 Procedure   SLP visit diagnosis: SLP Visit Diagnosis:  Dysphagia, unspecified (R13.10)    Past Medical History:  Past Medical History:  Diagnosis Date   Arthritis    Breast cancer (HCC)    stage I left   Bundle branch block    Bursitis    Cancer (HCC)    face   Chronic cystitis    Chronic pain    Coronary artery disease    Degenerative disc disease, lumbar    Family history of adverse reaction to anesthesia    pt states her son has to have large amount of anesthesia - difficulty to go under    Family history of breast cancer    Family history of pancreatic cancer 12/08/2019   GERD (gastroesophageal reflux disease)    Hemorrhoids    Hepatitis A 1976   History of measles    History of mumps    Malaria    Malaria    Menopause    Nocturia    Osteopenia    Osteoporosis    Personal history of malignant neoplasm of breast    PONV (postoperative nausea and vomiting)    Urinary tract infection    Varicose veins    Past Surgical History:  Past Surgical History:  Procedure Laterality Date   BARIATRIC SURGERY N/A    BREAST LUMPECTOMY  02/2010   left lumpectomy and sentinel node   SKIN CANCER DESTRUCTION     Nose cryotherapy   TOTAL HIP ARTHROPLASTY Right 06/14/2015   Procedure: RIGHT TOTAL HIP ARTHROPLASTY ANTERIOR APPROACH;  Surgeon: Dempsey Moan, MD;  Location: [facility];  Service: Orthopedics;  Laterality: Right;   VAGINAL HYSTERECTOMY  may 2008   and vaginal repair   Madelin POUR, MS [facility] SLP Acute Rehab Services Office [phone]  Nicolas Emmie Caldron 11/21/2023, 3:02 PM

## 2023-12-18 DIAGNOSIS — M25511 Pain in right shoulder: Secondary | ICD-10-CM | POA: Diagnosis not present

## 2024-01-12 ENCOUNTER — Other Ambulatory Visit: Payer: Self-pay | Admitting: Diagnostic Neuroimaging

## 2024-01-14 DIAGNOSIS — H40013 Open angle with borderline findings, low risk, bilateral: Secondary | ICD-10-CM | POA: Diagnosis not present

## 2024-01-14 DIAGNOSIS — H35013 Changes in retinal vascular appearance, bilateral: Secondary | ICD-10-CM | POA: Diagnosis not present

## 2024-01-14 DIAGNOSIS — H04123 Dry eye syndrome of bilateral lacrimal glands: Secondary | ICD-10-CM | POA: Diagnosis not present

## 2024-01-14 DIAGNOSIS — H524 Presbyopia: Secondary | ICD-10-CM | POA: Diagnosis not present

## 2024-01-14 DIAGNOSIS — H16223 Keratoconjunctivitis sicca, not specified as Sjogren's, bilateral: Secondary | ICD-10-CM | POA: Diagnosis not present

## 2024-01-20 DIAGNOSIS — M25511 Pain in right shoulder: Secondary | ICD-10-CM | POA: Diagnosis not present

## 2024-01-28 DIAGNOSIS — M25511 Pain in right shoulder: Secondary | ICD-10-CM | POA: Diagnosis not present

## 2024-02-02 ENCOUNTER — Ambulatory Visit: Admitting: Diagnostic Neuroimaging

## 2024-02-02 ENCOUNTER — Encounter: Payer: Self-pay | Admitting: Diagnostic Neuroimaging

## 2024-02-02 VITALS — BP 143/85 | HR 84 | Ht 61.0 in | Wt 151.0 lb

## 2024-02-02 DIAGNOSIS — G20C Parkinsonism, unspecified: Secondary | ICD-10-CM | POA: Diagnosis not present

## 2024-02-02 MED ORDER — CARBIDOPA-LEVODOPA 25-100 MG PO TABS: 1.0000 | ORAL_TABLET | Freq: Three times a day (TID) | ORAL | 4 refills | Status: AC

## 2024-02-02 NOTE — Progress Notes (Signed)
 [facility]  PATIENT: Anne Obrien DOB: 16-Oct-1940  REFERRING CLINICIAN: Cleotilde Planas, MD HISTORY FROM: PATIENT, daughter REASON FOR VISIT: follow up   HISTORICAL  CHIEF COMPLAINT:  Chief Complaint  Patient presents with   Tremors    Rm 6 with daughter  Pt is well and stable, reports occasional tremor but no other concerns.     HISTORY OF PRESENT ILLNESS:   UPDATE (02/02/24, VRP): Since last visit, doing patient is doing well and stable.  She is continuing to exercise several times a week.  Tolerating medication.  Tremor stable.  Might be having some intermittent shuffling and tripping with legs but is quite mild.  No speech or swallowing issues.  UPDATE (09/30/22, VRP): Since last visit, doing well!. Symptoms are improved. Now on carb/levo aand very active with exercises (PT, water, stationary bike).   PRIOR HPI: 83 year old female here for evaluation of tremor.  For past 2 years has noticed some tremor in left thumb.  She also has very minor tremor in his right thumb.  Has had some issues with decreased sleep and anxiety.  Also having worsening gait and balance.  Patient lives at home and is primary caregiver for her husband who has dementia.  They have a daughter also stays with them.  A different daughter is here for this visit today.  This daughter is a engineer, civil (consulting) at the emergency room.   REVIEW OF SYSTEMS: Full 14 system review of systems performed and negative with exception of: as per HPI.   ALLERGIES: Allergies  Allergen Reactions   Augmentin [Amoxicillin-Pot Clavulanate] Diarrhea    HOME MEDICATIONS: Outpatient Medications Prior to Visit  Medication Sig Dispense Refill   Ascorbic Acid (VITAMIN C) 500 MG CAPS Take 500 mg by mouth daily.     b complex vitamins capsule Take 1 capsule by mouth daily.     Cholecalciferol  (VITAMIN D3) 125 MCG (5000 UT) TABS Take 1 tablet by mouth daily.     Glucosamine 500 MG CAPS Take 500 mg by mouth in the  morning, at noon, and at bedtime.     Magnesium 250 MG TABS Take 250 mg by mouth daily.     Multiple Vitamins-Minerals (ALIVE ADULT PREMIUM PO) Take by mouth.     Omega-3 Fatty Acids  (FISH OIL) 1000 MG CAPS Take 1,000 mg by mouth daily.     Zinc 100 MG TABS Take 100 mg by mouth daily.     carbidopa -levodopa  (SINEMET  IR) 25-100 MG tablet TAKE 1 TABLET BY MOUTH THREE TIMES DAILY BEFORE MEAL(S) 90 tablet 0   acetaminophen  (TYLENOL ) 325 MG tablet Take 650 mg by mouth every 6 (six) hours as needed for moderate pain (pain score 4-6). (Patient not taking: Reported on 02/02/2024)     No facility-administered medications prior to visit.    PAST MEDICAL HISTORY: Past Medical History:  Diagnosis Date   Arthritis    Breast cancer (HCC)    stage I left   Bundle branch block    Bursitis    Cancer (HCC)    face   Chronic cystitis    Chronic pain    Coronary artery disease    Degenerative disc disease, lumbar    Family history of adverse reaction to anesthesia    pt states her son has to have large amount of anesthesia - difficulty to go under    Family history of breast cancer    Family history of pancreatic cancer 12/08/2019   GERD (gastroesophageal reflux disease)  Hemorrhoids    Hepatitis A 1976   History of measles    History of mumps    Malaria    Malaria    Menopause    Nocturia    Osteopenia    Osteoporosis    Personal history of malignant neoplasm of breast    PONV (postoperative nausea and vomiting)    Urinary tract infection    Varicose veins     PAST SURGICAL HISTORY: Past Surgical History:  Procedure Laterality Date   BARIATRIC SURGERY N/A    BREAST LUMPECTOMY  02/2010   left lumpectomy and sentinel node   SKIN CANCER DESTRUCTION     Nose cryotherapy   TOTAL HIP ARTHROPLASTY Right 06/14/2015   Procedure: RIGHT TOTAL HIP ARTHROPLASTY ANTERIOR APPROACH;  Surgeon: Dempsey Moan, MD;  Location: [facility];  Service: Orthopedics;  Laterality: Right;   VAGINAL HYSTERECTOMY   may 2008   and vaginal repair    FAMILY HISTORY: Family History  Problem Relation Age of Onset   Breast cancer Mother 38   Arthritis Mother    Pernicious anemia Father    Arthritis Sister    Arthritis Brother    Aneurysm Brother    Breast cancer Maternal Grandmother 61   Breast cancer Maternal Aunt 55   Cancer Maternal Uncle        NOS   Diabetes Paternal Uncle    Stroke Paternal Grandfather    Aneurysm Maternal Aunt    Breast cancer Cousin        mat first cousin   Ovarian cancer Cousin        mat first cousin   Cancer Maternal Uncle        blood cancer    SOCIAL HISTORY: Social History   Socioeconomic History   Marital status: Married    Spouse name: Not on file   Number of children: Not on file   Years of education: Not on file   Highest education level: Not on file  Occupational History   Not on file  Tobacco Use   Smoking status: Never   Smokeless tobacco: Never  Substance and Sexual Activity   Alcohol use: No   Drug use: No   Sexual activity: Yes    Birth control/protection: Post-menopausal, Surgical  Other Topics Concern   Not on file  Social History Narrative   Pt lives with husband and 2 daughters with dog and 2 cats.   Social Drivers of Corporate Investment Banker Strain: Not on file  Food Insecurity: Not on file  Transportation Needs: Not on file  Physical Activity: Not on file  Stress: Not on file  Social Connections: Unknown (08/21/2021)   [facility] from [facility]   Social Network    Social Network: Not on file  Intimate Partner Violence: Unknown (07/13/2021)   Received from [facility]   HITS    Physically Hurt: Not on file    Insult or Talk Down To: Not on file    Threaten Physical Harm: Not on file    Scream or Curse: Not on file     PHYSICAL EXAM  GENERAL EXAM/CONSTITUTIONAL: Vitals:  Vitals:   02/02/24 1508  BP: (!) 143/85  Pulse: 84  Weight: 151 lb (68.5 kg)  Height: 5' 1 (1.549 m)   Body mass index is 28.53  kg/m. Wt Readings from Last 3 Encounters:  02/02/24 151 lb (68.5 kg)  06/02/23 156 lb (70.8 kg)  09/30/22 148 lb 9.6 oz (67.4 kg)  Patient is in no distress; well developed, nourished and groomed; neck is supple  CARDIOVASCULAR: Examination of carotid arteries is normal; no carotid bruits Regular rate and rhythm, no murmurs Examination of peripheral vascular system by observation and palpation is normal  EYES: Ophthalmoscopic exam of optic discs and posterior segments is normal; no papilledema or hemorrhages No results found.  MUSCULOSKELETAL: Gait, strength, tone, movements noted in Neurologic exam below  NEUROLOGIC: MENTAL STATUS:      No data to display         awake, alert, oriented to person, place and time recent and remote memory intact normal attention and concentration language fluent, comprehension intact, naming intact fund of knowledge appropriate  CRANIAL NERVE:  2nd - no papilledema on fundoscopic exam 2nd, 3rd, 4th, 6th - pupils equal and reactive to light, visual fields full to confrontation, extraocular muscles intact, no nystagmus 5th - facial sensation symmetric 7th - facial strength symmetric 8th - hearing intact 9th - palate elevates symmetrically, uvula midline 11th - shoulder shrug symmetric 12th - tongue protrusion midline  MOTOR:  No tremor No rigidity No bradykinesia normal bulk and tone, full strength in the BUE, BLE  SENSORY:  normal and symmetric to light touch  COORDINATION:  finger-nose-finger, fine finger movements normal  REFLEXES:  deep tendon reflexes 1+ and symmetric  GAIT/STATION:  narrow based gait; good arm swing, stride and turning     DIAGNOSTIC DATA (LABS, IMAGING, TESTING) - I reviewed patient records, labs, notes, testing and imaging myself where available.  Lab Results  Component Value Date   WBC 19.9 (H) 06/16/2015   HGB 10.4 (L) 06/16/2015   HCT 31.6 (L) 06/16/2015   MCV 89.3 06/16/2015   PLT  165 06/16/2015      Component Value Date/Time   NA 144 06/16/2015 0339   NA 140 06/07/2015 1414   K 4.7 06/16/2015 0339   K 4.2 06/07/2015 1414   CL 109 06/16/2015 0339   CL 107 09/15/2012 1236   CO2 28 06/16/2015 0339   CO2 27 06/07/2015 1414   GLUCOSE 135 (H) 06/16/2015 0339   GLUCOSE 94 06/07/2015 1414   GLUCOSE 117 (H) 09/15/2012 1236   BUN 13 06/16/2015 0339   BUN 18.5 06/07/2015 1414   CREATININE 0.55 06/16/2015 0339   CREATININE 1.1 06/07/2015 1414   CALCIUM 9.1 06/16/2015 0339   CALCIUM 8.8 06/07/2015 1414   PROT 6.4 06/07/2015 1414   ALBUMIN 3.5 06/07/2015 1414   AST 16 06/07/2015 1414   ALT <9 06/07/2015 1414   ALKPHOS 54 06/07/2015 1414   BILITOT 0.35 06/07/2015 1414   GFRNONAA >60 06/16/2015 0339   GFRAA >60 06/16/2015 0339   No results found for: CHOL, HDL, LDLCALC, LDLDIRECT, TRIG, CHOLHDL No results found for: YHAJ8R No results found for: VITAMINB12 No results found for: TSH   06/17/22 MRI brain  - normal   ASSESSMENT AND PLAN  83 y.o. year old female here with gradual progressive resting tremor, bradykinesia, cogwheel rigidity, gait difficulty, signs symptoms most consistent with idiopathic Parkinson disease.   Dx:  1. Parkinsonism, unspecified Parkinsonism type (HCC)     PLAN:  PARKINSONISM (good response to carb/levo) - continue PT exercises (for gait training) - continue carbidopa  / levodopa  (25/100) 1 tab three times a day with meals  Meds ordered this encounter  Medications   carbidopa -levodopa  (SINEMET  IR) 25-100 MG tablet    Sig: Take 1 tablet by mouth 3 (three) times daily.    Dispense:  270 tablet  Refill:  4   Return in about 1 year (around 02/01/2025).    EDUARD FABIENE HANLON, MD 02/02/2024, 4:19 PM Certified in Neurology, Neurophysiology and Neuroimaging  [facility] [address] [place], [place] [address] [phone]

## 2024-04-09 ENCOUNTER — Other Ambulatory Visit (HOSPITAL_COMMUNITY)
Admission: RE | Admit: 2024-04-09 | Discharge: 2024-04-09 | Disposition: A | Discharge status: Home / Self Care | Source: Other Acute Inpatient Hospital | Attending: Obstetrics and Gynecology | Admitting: Obstetrics and Gynecology

## 2024-04-09 ENCOUNTER — Ambulatory Visit: Admitting: Obstetrics and Gynecology

## 2024-04-09 ENCOUNTER — Encounter: Payer: Self-pay | Admitting: Obstetrics and Gynecology

## 2024-04-09 VITALS — BP 122/74 | HR 94 | Ht 61.0 in | Wt 145.0 lb

## 2024-04-09 DIAGNOSIS — R300 Dysuria: Secondary | ICD-10-CM | POA: Insufficient documentation

## 2024-04-09 DIAGNOSIS — N393 Stress incontinence (female) (male): Secondary | ICD-10-CM | POA: Diagnosis not present

## 2024-04-09 DIAGNOSIS — N993 Prolapse of vaginal vault after hysterectomy: Secondary | ICD-10-CM | POA: Diagnosis not present

## 2024-04-09 DIAGNOSIS — N952 Postmenopausal atrophic vaginitis: Secondary | ICD-10-CM | POA: Diagnosis not present

## 2024-04-09 DIAGNOSIS — N8110 Cystocele, unspecified: Secondary | ICD-10-CM

## 2024-04-09 LAB — POCT URINE DIPSTICK
Bilirubin, UA: NEGATIVE
Glucose, UA: NEGATIVE mg/dL
Ketones, POC UA: NEGATIVE mg/dL
Nitrite, UA: NEGATIVE
POC PROTEIN,UA: NEGATIVE
Spec Grav, UA: 1.015
Urobilinogen, UA: 0.2 U/dL
pH, UA: 5.5

## 2024-04-09 LAB — POCT URINALYSIS DIP (CLINITEK)
Bilirubin, UA: NEGATIVE
Glucose, UA: NEGATIVE mg/dL
Ketones, POC UA: NEGATIVE mg/dL
Nitrite, UA: NEGATIVE
POC PROTEIN,UA: NEGATIVE
Spec Grav, UA: 1.01
Urobilinogen, UA: 0.2 U/dL
pH, UA: 7

## 2024-04-09 NOTE — Progress Notes (Signed)
 [facility] New Patient Evaluation and Consultation  Referring Provider: Cleotilde Planas, MD PCP: Cleotilde Planas, MD Date of Service: 04/09/2024  SUBJECTIVE Chief Complaint: New Patient (Initial Visit) (Prolapse of vaginal vault after hysterectomy/)  History of Present Illness: Anne Obrien is a 84 y.o. White or Caucasian female seen in consultation at the request of Dr. Cleotilde for evaluation of prolpase.    Review of records significant for: Hx of vaginal estrogen cream use Currently has a pessary in (3 1/4in Short Stem Gellhorn)   Urinary Symptoms: Leaks urine with cough/ sneeze and with a full bladder Leaks 2-4 time(s) per days.  Pad use: None  Patient is not bothered by UI symptoms.  Day time voids 10.  Nocturia: 0-1 times per night to void. Voiding dysfunction:  does not empty bladder well.  Patient does not use a catheter to empty bladder.  When urinating, patient feels dribbling after finishing   UTIs: 0 UTI's in the last year.   Denies history of urologic concerns.  No results found for the last 90 days.   Pelvic Organ Prolapse Symptoms:                  Patient Admits to a feeling of a bulge the vaginal area. It has been present for 5 years.  Patient Admits to seeing a bulge.  This bulge is bothersome.  Bowel Symptom: Bowel movements: 3-4 time(s) per day Stool consistency: soft  Straining: no.  Splinting: no.  Incomplete evacuation: no.  Patient Denies accidental bowel leakage / fecal incontinence Bowel regimen: diet Last colonoscopy: Date 2018, Results polyps HM Colonoscopy   This patient has no relevant Health Maintenance data.     Sexual Function Sexually active: no.  Sexual orientation: Straight Pain with sex: No  Pelvic Pain Denies pelvic pain    Past Medical History:  Past Medical History:  Diagnosis Date   Arthritis    Breast cancer (HCC)    stage I left   Bundle branch block    Bursitis    Cancer (HCC)    face    Chronic cystitis    Chronic pain    Coronary artery disease    Degenerative disc disease, lumbar    Family history of adverse reaction to anesthesia    pt states her son has to have large amount of anesthesia - difficulty to go under    Family history of breast cancer    Family history of pancreatic cancer 12/08/2019   GERD (gastroesophageal reflux disease)    Hemorrhoids    Hepatitis A 1976   History of measles    History of mumps    Malaria    Malaria    Menopause    Nocturia    Osteopenia    Osteoporosis    Personal history of malignant neoplasm of breast    PONV (postoperative nausea and vomiting)    Urinary tract infection    Varicose veins      Past Surgical History:   Past Surgical History:  Procedure Laterality Date   BARIATRIC SURGERY N/A    BREAST LUMPECTOMY  02/2010   left lumpectomy and sentinel node   SKIN CANCER DESTRUCTION     Nose cryotherapy   TOTAL HIP ARTHROPLASTY Right 06/14/2015   Procedure: RIGHT TOTAL HIP ARTHROPLASTY ANTERIOR APPROACH;  Surgeon: Anne Moan, MD;  Location: [facility];  Service: Orthopedics;  Laterality: Right;   VAGINAL HYSTERECTOMY  may 2008   and vaginal repair  Past OB/GYN History: [id number] Menopausal: Yes, at age 85 Contraception: Hyst. Last pap smear was Hysterectomy HM PAP   This patient has no relevant Health Maintenance data.     Medications: Patient has a current medication list which includes the following prescription(s): vitamin c, b complex vitamins, carbidopa -levodopa , vitamin d3, estradiol, glucosamine, magnesium, multiple vitamins-minerals, fish oil, and zinc.   Allergies: Patient is allergic to augmentin [amoxicillin-pot clavulanate].   Social History: Social History[1]  Relationship status: married Patient lives with her husband and two adult children.   Patient is not employed retired and does house and yard work. Regular exercise: Yes: Walking, stationary bike History of abuse: No  Family History:    Family History  Problem Relation Age of Onset   Breast cancer Mother 3   Arthritis Mother    Pernicious anemia Father    Arthritis Sister    Arthritis Brother    Aneurysm Brother    Breast cancer Maternal Grandmother 34   Breast cancer Maternal Aunt 55   Cancer Maternal Uncle        NOS   Diabetes Paternal Uncle    Stroke Paternal Grandfather    Aneurysm Maternal Aunt    Breast cancer Cousin        mat first cousin   Ovarian cancer Cousin        mat first cousin   Cancer Maternal Uncle        blood cancer     Review of Systems: Review of Systems  Constitutional:  Positive for malaise/fatigue. Negative for chills and fever.  Respiratory:  Positive for cough. Negative for shortness of breath.   Cardiovascular:  Negative for chest pain and palpitations.  Gastrointestinal:  Negative for abdominal pain, blood in stool, constipation and diarrhea.  Skin:  Negative for rash.  Neurological:  Negative for weakness.  Endo/Heme/Allergies:  Bruises/bleeds easily.  Psychiatric/Behavioral:  Negative for depression and suicidal ideas.      OBJECTIVE Physical Exam: Vitals:   04/09/24 1021  BP: 122/74  Pulse: 94  SpO2: 97%  Weight: 145 lb (65.8 kg)  Height: 5' 1 (1.549 m)    Physical Exam Vitals reviewed. Exam conducted with a chaperone present.  Constitutional:      Appearance: Normal appearance.  Pulmonary:     Effort: Pulmonary effort is normal.  Abdominal:     Palpations: Abdomen is soft.  Neurological:     General: No focal deficit present.     Mental Status: She is alert and oriented to person, place, and time.  Psychiatric:        Mood and Affect: Mood normal.        Behavior: Behavior normal. Behavior is cooperative.        Thought Content: Thought content normal.      GU / Detailed Urogynecologic Evaluation:  Pelvic Exam: Normal external female genitalia; Bartholin's and Skene's glands normal in appearance; urethral meatus normal in appearance, no  urethral masses or discharge. Patient has a #7 short stem Gellhorn pessary in place. This was noted to be flipped on evaluation and pressing into the posterior vaginal wall causing friction. The posterior fourchette tore a small amount with removal.   CST: positive  s/p hysterectomy: Speculum exam reveals normal vaginal mucosa with  atrophy and abrasion noted to posterior vaginal wall and normal vaginal cuff.  Adnexa normal adnexa.    With apex supported, anterior compartment defect was reduced  Pelvic floor strength I/V  Pelvic floor musculature: Right levator non-tender, Right obturator  non-tender, Left levator non-tender, Left obturator non-tender  POP-Q:   POP-Q  0                                            Aa   0                                           Ba  -3                                              C   6                                            Gh  4.5                                            Pb  8.5                                            tvl   -1.5                                            Ap  -1.5                                            Bp                                                 D      Rectal Exam:  Normal external exam.   Post-Void Residual (PVR) by Straight cath: In order to evaluate bladder emptying, we discussed obtaining a postvoid residual and patient agreed to this procedure.  Procedure: An in and out cath was done and PVR was noted to be      Laboratory Results: Lab Results  Component Value Date   COLORU yellow 04/09/2024   CLARITYU clear 04/09/2024   GLUCOSEUR negative 04/09/2024   BILIRUBINUR negative 04/09/2024   SPECGRAV 1.010 04/09/2024   RBCUR trace-lysed (A) 04/09/2024   PHUR 7.0 04/09/2024   PROTEINUR NEGATIVE 06/06/2015   UROBILINOGEN 0.2 04/09/2024   LEUKOCYTESUR Small (1+) (A) 04/09/2024    Lab Results  Component Value Date   CREATININE 0.55 06/16/2015   CREATININE 0.50 06/15/2015    CREATININE 1.1 06/07/2015    No results found for: HGBA1C  Lab Results  Component Value Date   HGB 10.4 (L) 06/16/2015  ASSESSMENT AND PLAN Ms. Kaffenberger is a 85 y.o. with:  1. Vaginal vault prolapse after hysterectomy   2. Prolapse of anterior vaginal wall   3. Vaginal atrophy   4. SUI (stress urinary incontinence, female)   5. Dysuria    Patient has stage II/IV anterior vaginal prolapse that may be more of a stage III if pessary were to be left out. The pessary that was in was noted to be too large and had flipped to push on the back vaginal wall. When this was removed there was some mild tearing to the posterior fourchette as well.  Replaced patient's pessary with a smaller (#4 Short Stem Gellhorn) pessary. Patient is interested in surgical repair due to long term concern for management with a pessary. She is the primary caregiver for her husband who has dementia and has significant concern that she could have side effects from anesthesia. We briefly discussed surgical repair options including; Sacrocolpopexy, sacrospinous ligament fixation, and colpocleisis. She was given handouts on all three surgical options and her daughter was also given copies to consider. Patient has a hx of parkinson's which may impact surgical candidacy. Patient to meet with surgeon to further discuss options of repair vs expectant management.  Patient has vaginal atrophy on exam. She is currently on estrogen cream. Patient to use a blueberry sized amount into the vagina. She may use this nightly for week and then twice weekly after for maintenance due to the vaginal irritation related to removal of pessary.  Patient has +CST on exam with cough. We briefly discussed that this can be addressed should she decide for surgical repair and we discussed options of sling vs. Urethral bulking.  Urine has small leukocytes on cath sample. Will send for culture to rule out underlying infection.   Patient to follow up to  discuss surgical options further. We can plan on UDS if surgeon desires testing.    Zareya Tuckett G Russie Gulledge, NP       [1]  Social History Tobacco Use   Smoking status: Never   Smokeless tobacco: Never  Substance Use Topics   Alcohol use: No   Drug use: No

## 2024-04-11 LAB — URINE CULTURE: Culture: 60000 — AB

## 2024-04-16 ENCOUNTER — Encounter: Payer: Self-pay | Admitting: Obstetrics and Gynecology

## 2024-04-16 ENCOUNTER — Ambulatory Visit: Admitting: Obstetrics and Gynecology

## 2024-04-16 VITALS — BP 114/75 | HR 105

## 2024-04-16 DIAGNOSIS — N393 Stress incontinence (female) (male): Secondary | ICD-10-CM | POA: Diagnosis not present

## 2024-04-16 DIAGNOSIS — N993 Prolapse of vaginal vault after hysterectomy: Secondary | ICD-10-CM

## 2024-04-16 NOTE — Progress Notes (Signed)
 [facility] Return Visit  SUBJECTIVE  History of Present Illness: Anne Obrien is a 84 y.o. female seen in follow-up for prolapse and SUI. She had positive CST.   She is interested in a non-mesh procedure for her prolapse. Currently has a #4 ss gellhorn pessary and this has been comfortable for her. Denies vaginal bleeding.    Past Medical History: Patient  has a past medical history of Arthritis, Breast cancer (HCC), Bundle branch block, Bursitis, Cancer (HCC), Chronic cystitis, Chronic pain, Coronary artery disease, Degenerative disc disease, lumbar, Family history of adverse reaction to anesthesia, Family history of breast cancer, Family history of pancreatic cancer (12/08/2019), GERD (gastroesophageal reflux disease), Hemorrhoids, Hepatitis A (1976), History of measles, History of mumps, Malaria, Malaria, Menopause, Nocturia, Osteopenia, Osteoporosis, Personal history of malignant neoplasm of breast, PONV (postoperative nausea and vomiting), Urinary tract infection, and Varicose veins.   Past Surgical History: She  has a past surgical history that includes Vaginal hysterectomy (may 2008); Breast lumpectomy (02/2010); Skin cancer destruction; Bariatric Surgery (N/A); and Total hip arthroplasty (Right, 06/14/2015).   Medications: She has a current medication list which includes the following prescription(s): vitamin c, b complex vitamins, carbidopa -levodopa , vitamin d3, estradiol, glucosamine, magnesium, multiple vitamins-minerals, fish oil, and zinc.   Allergies: Patient is allergic to augmentin [amoxicillin-pot clavulanate].   Social History: Patient  reports that she has never smoked. She has never used smokeless tobacco. She reports that she does not drink alcohol and does not use drugs.     OBJECTIVE     Physical Exam: Vitals:   04/16/24 0835  BP: 114/75  Pulse: (!) 105   Gen: No apparent distress, A&O x 3.  Detailed Urogynecologic Evaluation:  Pessary in  place but stem is slightly posterior. Removed without difficulty.  On speculum, normal vaginal mucosa with small abrasion treated with silver nitrate. On bimanual, no masses present.   POP-Q  1                                            Aa   1                                           Ba  -6                                              C   5                                            Gh  4                                            Pb  8  tvl   -2                                            Ap  -2                                            Bp                                                 D      ASSESSMENT AND PLAN    Anne Obrien is a 84 y.o. with:  1. Vaginal vault prolapse after hysterectomy   2. SUI (stress urinary incontinence, female)     Plan for surgery: Exam under anesthesia, Colpocleisis, levator plication, perineorrhaphy, cystoscopy, urethral bulking  - We reviewed the patient's specific anatomic and functional findings, with the assistance of diagrams, and together finalized the above procedure. The planned surgical procedures were discussed along with the surgical risks outlined below, which were also provided on a detailed handout. Additional treatment options including expectant management, conservative management, medical management were discussed where appropriate.  We reviewed the benefits and risks of each treatment option.   General Surgical Risks: For all procedures, there are risks of bleeding, infection, damage to surrounding organs including but not limited to bowel, bladder, blood vessels, ureters and nerves, and need for further surgery if an injury were to occur. These risks are all low with minimally invasive surgery.   There are risks of numbness and weakness at any body site or buttock/rectal pain.  It is possible that baseline pain can be worsened by surgery, either with or without mesh. If surgery is  vaginal, there is also a low risk of possible conversion to laparoscopy or open abdominal incision where indicated. Very rare risks include blood transfusion, blood clot, heart attack, pneumonia, or death.   There is also a risk of short-term postoperative urinary retention with need to use a catheter. About half of patients need to go home from surgery with a catheter, which is then later removed in the office. The risk of long-term need for a catheter is very low. There is also a risk of worsening of overactive bladder.   Bulkamid: We discussed success rate of approximately 70-80% and possible need for second injection. We reviewed that this is not a permanent procedure and the Bulkamid does become less effective over time. Risks reviewed including injury to bladder or urethra, UTI, urinary retention and hematuria.   Prolapse (with or without mesh): Risk factors for surgical failure  include things that put pressure on your pelvis and the surgical repair, including obesity, chronic cough, and heavy lifting or straining (including lifting children or adults, straining on the toilet, or lifting heavy objects such as furniture or anything weighing >25 lbs. Risks of recurrence is 20-30% with vaginal native tissue repair and a less than 10% with sacrocolpopexy with mesh, and <5% for colpocleisis   - For preop Visit:  She is required to have a visit within 30 days of her surgery.    - Medical clearance: not required  - Anticoagulant use: No - Medicaid Hysterectomy  form: n/a - Accepts blood transfusion: Yes - Expected length of stay: outpatient  Request sent for surgery scheduling.   Rosaline LOISE Caper, MD

## 2024-04-16 NOTE — Patient Instructions (Addendum)
 Plan for surgery: Colpocleisis, perineorrhaphy (narrowing of vaginal opening), cystoscopy (camera in the bladder), urethral bulking

## 2024-04-19 ENCOUNTER — Ambulatory Visit: Payer: Self-pay | Admitting: Obstetrics and Gynecology

## 2024-04-19 DIAGNOSIS — R300 Dysuria: Secondary | ICD-10-CM

## 2024-04-19 MED ORDER — SULFAMETHOXAZOLE-TRIMETHOPRIM 800-160 MG PO TABS: 1.0000 | ORAL_TABLET | Freq: Two times a day (BID) | ORAL | 0 refills | Status: AC

## 2024-04-20 NOTE — Progress Notes (Signed)
 Attempted to contact patient. Anne Obrien

## 2024-04-20 NOTE — Progress Notes (Signed)
 Patient has been notified

## 2024-05-11 ENCOUNTER — Ambulatory Visit: Admitting: Obstetrics and Gynecology

## 2024-05-11 VITALS — BP 146/81 | HR 102

## 2024-05-11 DIAGNOSIS — N993 Prolapse of vaginal vault after hysterectomy: Secondary | ICD-10-CM

## 2024-05-11 DIAGNOSIS — N393 Stress incontinence (female) (male): Secondary | ICD-10-CM

## 2024-05-11 DIAGNOSIS — N812 Incomplete uterovaginal prolapse: Secondary | ICD-10-CM | POA: Diagnosis not present

## 2024-05-11 MED ORDER — IBUPROFEN 600 MG PO TABS: 600.0000 mg | ORAL_TABLET | Freq: Four times a day (QID) | ORAL | 0 refills | Status: AC | PRN

## 2024-05-11 MED ORDER — OXYCODONE HCL 5 MG PO TABS: 5.0000 mg | ORAL_TABLET | ORAL | 0 refills | Status: AC | PRN

## 2024-05-11 MED ORDER — ESTRADIOL 0.01 % VA CREA: TOPICAL_CREAM | VAGINAL | 11 refills | Status: AC

## 2024-05-11 MED ORDER — POLYETHYLENE GLYCOL 3350 17 GM/SCOOP PO POWD: 17.0000 g | Freq: Every day | ORAL | 0 refills | Status: AC

## 2024-05-11 MED ORDER — ACETAMINOPHEN 500 MG PO TABS: 500.0000 mg | ORAL_TABLET | Freq: Four times a day (QID) | ORAL | 0 refills | Status: AC | PRN

## 2024-05-12 ENCOUNTER — Encounter: Payer: Self-pay | Admitting: Obstetrics and Gynecology

## 2024-05-13 NOTE — H&P (Signed)
 [facility] Pre op H&P  Subjective  History of Present Illness: Anne Obrien is a 84 y.o. female who presents for a return visit.  She is scheduled to undergo Exam under anesthesia, Colpocleisis, levator plication, perineorrhaphy, cystoscopy, urethral bulking on 05/18/24.  Her symptoms include vaginal bulge and incontinence, and she was was found to have Stage II anterior, Stage I posterior, Stage I apical prolapse.   She had positive cough stress test.  Past Medical History:  Diagnosis Date   Arthritis    Breast cancer (HCC)    stage I left   Bundle branch block    Bursitis    Cancer (HCC)    face   Chronic cystitis    Chronic pain    Coronary artery disease    Degenerative disc disease, lumbar    Family history of adverse reaction to anesthesia    pt states her son has to have large amount of anesthesia - difficulty to go under    Family history of breast cancer    Family history of pancreatic cancer 12/08/2019   GERD (gastroesophageal reflux disease)    Hemorrhoids    Hepatitis A 1976   History of measles    History of mumps    Malaria    Malaria    Menopause    Nocturia    Osteopenia    Osteoporosis    Personal history of malignant neoplasm of breast    PONV (postoperative nausea and vomiting)    Urinary tract infection    Varicose veins      Past Surgical History:  Procedure Laterality Date   BARIATRIC SURGERY N/A    BREAST LUMPECTOMY  02/2010   left lumpectomy and sentinel node   SKIN CANCER DESTRUCTION     Nose cryotherapy   TOTAL HIP ARTHROPLASTY Right 06/14/2015   Procedure: RIGHT TOTAL HIP ARTHROPLASTY ANTERIOR APPROACH;  Surgeon: Dempsey Moan, MD;  Location: [facility];  Service: Orthopedics;  Laterality: Right;   VAGINAL HYSTERECTOMY  may 2008   and vaginal repair    is allergic to augmentin [amoxicillin-pot clavulanate].   Family History  Problem Relation Age of Onset   Breast cancer Mother 64   Arthritis Mother    Pernicious  anemia Father    Arthritis Sister    Arthritis Brother    Aneurysm Brother    Breast cancer Maternal Grandmother 38   Breast cancer Maternal Aunt 55   Cancer Maternal Uncle        NOS   Diabetes Paternal Uncle    Stroke Paternal Grandfather    Aneurysm Maternal Aunt    Breast cancer Cousin        mat first cousin   Ovarian cancer Cousin        mat first cousin   Cancer Maternal Uncle        blood cancer    Social History[1]   Review of Systems was negative for a full 10 system review except as noted in the History of Present Illness.  Current Medications[2]   Objective There were no vitals filed for this visit.   Gen: NAD CV: S1 S2 RRR Lungs: Clear to auscultation bilaterally Abd: soft, nontender  Pessary stem noted to be posterior displaced. Pessary removed today. Speculum exam reveals one lesion on the posterior vagina, non-bleeding. Pessary left out of place.   Previous Pelvic Exam showed: POP-Q   1  Aa   1                                           Ba   -6                                              C    5                                            Gh   4                                            Pb   8                                            tvl    -2                                            Ap   -2                                            Bp                                                  D       Assessment/ Plan  The patient is a 84 y.o. year old with Stage II POP and SUI scheduled to undergo Exam under anesthesia, Colpocleisis, levator plication, perineorrhaphy, cystoscopy, urethral bulking    Rosaline LOISE Caper, MD        [1]  Social History Tobacco Use   Smoking status: Never   Smokeless tobacco: Never  Substance Use Topics   Alcohol use: No   Drug use: No  [2] No current facility-administered medications for this encounter.  Current Outpatient Medications:     acetaminophen  (TYLENOL ) 500 MG tablet, Take 1 tablet (500 mg total) by mouth every 6 (six) hours as needed (pain)., Disp: 30 tablet, Rfl: 0   Ascorbic Acid (VITAMIN C) 500 MG CAPS, Take 500 mg by mouth daily., Disp: , Rfl:    b complex vitamins capsule, Take 1 capsule by mouth daily., Disp: , Rfl:    carbidopa -levodopa  (SINEMET  IR) 25-100 MG tablet, Take 1 tablet by mouth 3 (three) times daily., Disp: 270 tablet, Rfl: 4   Cholecalciferol  (VITAMIN D3) 125 MCG (5000 UT) TABS, Take 1 tablet by mouth daily., Disp: , Rfl:    estradiol  (ESTRACE ) 0.01 % CREA vaginal cream, Place 1 Applicatorful vaginally., Disp: , Rfl:  estradiol  (ESTRACE ) 0.01 % CREA vaginal cream, Place 0.5g nightly for two weeks then twice a week after, Disp: 42.5 g, Rfl: 11   Glucosamine 500 MG CAPS, Take 500 mg by mouth in the morning, at noon, and at bedtime., Disp: , Rfl:    ibuprofen  (ADVIL ) 600 MG tablet, Take 1 tablet (600 mg total) by mouth every 6 (six) hours as needed., Disp: 30 tablet, Rfl: 0   Magnesium 250 MG TABS, Take 250 mg by mouth daily., Disp: , Rfl:    Multiple Vitamins-Minerals (ALIVE ADULT PREMIUM PO), Take by mouth., Disp: , Rfl:    Omega-3 Fatty Acids  (FISH OIL) 1000 MG CAPS, Take 1,000 mg by mouth daily., Disp: , Rfl:    oxyCODONE  (OXY IR/ROXICODONE ) 5 MG immediate release tablet, Take 1 tablet (5 mg total) by mouth every 4 (four) hours as needed for severe pain (pain score 7-10)., Disp: 5 tablet, Rfl: 0   polyethylene glycol powder (GLYCOLAX /MIRALAX ) 17 GM/SCOOP powder, Take 17 g by mouth daily. Drink 17g (1 scoop) dissolved in water per day., Disp: 255 g, Rfl: 0   Zinc 100 MG TABS, Take 100 mg by mouth daily., Disp: , Rfl:

## 2024-05-18 ENCOUNTER — Encounter (HOSPITAL_COMMUNITY): Admission: RE | Payer: Self-pay | Source: Home / Self Care

## 2024-05-18 ENCOUNTER — Ambulatory Visit (HOSPITAL_COMMUNITY): Admission: RE | Admit: 2024-05-18 | Source: Home / Self Care | Admitting: Obstetrics and Gynecology

## 2024-05-20 ENCOUNTER — Ambulatory Visit: Admitting: Occupational Therapy

## 2024-05-20 ENCOUNTER — Ambulatory Visit: Admitting: Physical Therapy

## 2024-07-02 ENCOUNTER — Encounter: Admitting: Obstetrics and Gynecology

## 2025-02-14 ENCOUNTER — Ambulatory Visit: Admitting: Diagnostic Neuroimaging
